# Patient Record
Sex: Male | Born: 1950
Health system: Southern US, Community
[De-identification: ages and names within clinical notes are randomized; demographics above are authoritative.]

## PROBLEM LIST (undated history)

## (undated) DIAGNOSIS — J449 Chronic obstructive pulmonary disease, unspecified: Secondary | ICD-10-CM

## (undated) DIAGNOSIS — I10 Essential (primary) hypertension: Secondary | ICD-10-CM

## (undated) DIAGNOSIS — C3490 Malignant neoplasm of unspecified part of unspecified bronchus or lung: Secondary | ICD-10-CM

## (undated) HISTORY — PX: LUNG SURGERY: SHX703

## (undated) HISTORY — PX: KNEE ARTHROSCOPY: SUR90

## (undated) HISTORY — DX: Malignant neoplasm of unspecified part of unspecified bronchus or lung: C34.90

## (undated) HISTORY — DX: Chronic obstructive pulmonary disease, unspecified: J44.9

---

## 2002-03-07 ENCOUNTER — Encounter: Payer: Self-pay | Admitting: Cardiology

## 2002-03-07 ENCOUNTER — Encounter: Admission: RE | Admit: 2002-03-07 | Discharge: 2002-03-07 | Payer: Self-pay | Admitting: Cardiology

## 2002-03-29 ENCOUNTER — Encounter: Payer: Self-pay | Admitting: Thoracic Surgery

## 2002-04-01 ENCOUNTER — Inpatient Hospital Stay (HOSPITAL_COMMUNITY): Admission: RE | Admit: 2002-04-01 | Discharge: 2002-04-06 | Payer: Self-pay | Admitting: Thoracic Surgery

## 2002-04-01 ENCOUNTER — Encounter: Payer: Self-pay | Admitting: Thoracic Surgery

## 2002-04-02 ENCOUNTER — Encounter: Payer: Self-pay | Admitting: Thoracic Surgery

## 2002-04-03 ENCOUNTER — Encounter: Payer: Self-pay | Admitting: Thoracic Surgery

## 2002-04-04 ENCOUNTER — Encounter: Payer: Self-pay | Admitting: Thoracic Surgery

## 2002-04-05 ENCOUNTER — Encounter: Payer: Self-pay | Admitting: Oncology

## 2002-04-06 ENCOUNTER — Encounter: Payer: Self-pay | Admitting: Thoracic Surgery

## 2002-04-12 ENCOUNTER — Encounter: Payer: Self-pay | Admitting: Thoracic Surgery

## 2002-04-12 ENCOUNTER — Encounter: Admission: RE | Admit: 2002-04-12 | Discharge: 2002-04-12 | Payer: Self-pay | Admitting: Thoracic Surgery

## 2002-04-29 ENCOUNTER — Ambulatory Visit: Admission: RE | Admit: 2002-04-29 | Discharge: 2002-07-07 | Payer: Self-pay | Admitting: *Deleted

## 2002-05-03 ENCOUNTER — Encounter: Admission: RE | Admit: 2002-05-03 | Discharge: 2002-05-03 | Payer: Self-pay | Admitting: Thoracic Surgery

## 2002-05-03 ENCOUNTER — Encounter: Payer: Self-pay | Admitting: Thoracic Surgery

## 2002-06-30 ENCOUNTER — Emergency Department (HOSPITAL_COMMUNITY): Admission: EM | Admit: 2002-06-30 | Discharge: 2002-06-30 | Payer: Self-pay | Admitting: Emergency Medicine

## 2002-07-26 ENCOUNTER — Ambulatory Visit: Admission: RE | Admit: 2002-07-26 | Discharge: 2002-07-26 | Payer: Self-pay | Admitting: *Deleted

## 2002-09-25 ENCOUNTER — Ambulatory Visit (HOSPITAL_COMMUNITY): Admission: RE | Admit: 2002-09-25 | Discharge: 2002-09-25 | Payer: Self-pay | Admitting: Oncology

## 2002-09-25 ENCOUNTER — Encounter: Payer: Self-pay | Admitting: Oncology

## 2002-10-29 ENCOUNTER — Ambulatory Visit (HOSPITAL_COMMUNITY): Admission: RE | Admit: 2002-10-29 | Discharge: 2002-10-29 | Payer: Self-pay | Admitting: Oncology

## 2002-10-29 ENCOUNTER — Encounter: Payer: Self-pay | Admitting: Oncology

## 2003-01-31 ENCOUNTER — Encounter: Payer: Self-pay | Admitting: Oncology

## 2003-01-31 ENCOUNTER — Ambulatory Visit (HOSPITAL_COMMUNITY): Admission: RE | Admit: 2003-01-31 | Discharge: 2003-01-31 | Payer: Self-pay | Admitting: Oncology

## 2003-05-29 ENCOUNTER — Ambulatory Visit (HOSPITAL_COMMUNITY): Admission: RE | Admit: 2003-05-29 | Discharge: 2003-05-29 | Payer: Self-pay | Admitting: Oncology

## 2003-08-26 ENCOUNTER — Ambulatory Visit (HOSPITAL_COMMUNITY): Admission: RE | Admit: 2003-08-26 | Discharge: 2003-08-26 | Payer: Self-pay | Admitting: Oncology

## 2004-01-05 ENCOUNTER — Ambulatory Visit (HOSPITAL_COMMUNITY): Admission: RE | Admit: 2004-01-05 | Discharge: 2004-01-05 | Payer: Self-pay | Admitting: Oncology

## 2004-01-16 ENCOUNTER — Ambulatory Visit (HOSPITAL_COMMUNITY): Admission: RE | Admit: 2004-01-16 | Discharge: 2004-01-16 | Payer: Self-pay | Admitting: Oncology

## 2004-02-22 ENCOUNTER — Emergency Department (HOSPITAL_COMMUNITY): Admission: EM | Admit: 2004-02-22 | Discharge: 2004-02-22 | Payer: Self-pay | Admitting: Emergency Medicine

## 2004-04-05 ENCOUNTER — Emergency Department (HOSPITAL_COMMUNITY): Admission: EM | Admit: 2004-04-05 | Discharge: 2004-04-05 | Payer: Self-pay | Admitting: Emergency Medicine

## 2004-04-23 ENCOUNTER — Ambulatory Visit (HOSPITAL_COMMUNITY): Admission: RE | Admit: 2004-04-23 | Discharge: 2004-04-23 | Payer: Self-pay | Admitting: Oncology

## 2004-05-24 ENCOUNTER — Ambulatory Visit: Payer: Self-pay | Admitting: Oncology

## 2004-09-02 ENCOUNTER — Ambulatory Visit: Payer: Self-pay | Admitting: Oncology

## 2004-09-06 ENCOUNTER — Ambulatory Visit (HOSPITAL_COMMUNITY): Admission: RE | Admit: 2004-09-06 | Discharge: 2004-09-06 | Payer: Self-pay | Admitting: Oncology

## 2004-12-28 ENCOUNTER — Ambulatory Visit: Payer: Self-pay | Admitting: Oncology

## 2005-07-02 ENCOUNTER — Emergency Department (HOSPITAL_COMMUNITY): Admission: EM | Admit: 2005-07-02 | Discharge: 2005-07-02 | Payer: Self-pay | Admitting: Emergency Medicine

## 2006-02-27 ENCOUNTER — Ambulatory Visit: Payer: Self-pay | Admitting: Oncology

## 2006-03-09 LAB — COMPREHENSIVE METABOLIC PANEL
ALT: 15 U/L (ref 0–53)
AST: 15 U/L (ref 0–37)
Calcium: 9.5 mg/dL (ref 8.4–10.5)
Chloride: 103 mEq/L (ref 96–112)
Creatinine, Ser: 1.11 mg/dL (ref 0.40–1.50)
Potassium: 4.2 mEq/L (ref 3.5–5.3)
Sodium: 138 mEq/L (ref 135–145)
Total Protein: 6.8 g/dL (ref 6.0–8.3)

## 2006-03-09 LAB — CBC WITH DIFFERENTIAL/PLATELET
BASO%: 0.5 % (ref 0.0–2.0)
EOS%: 1.1 % (ref 0.0–7.0)
MCH: 31.6 pg (ref 28.0–33.4)
MCHC: 34.9 g/dL (ref 32.0–35.9)
NEUT%: 64.8 % (ref 40.0–75.0)
RBC: 5.02 10*6/uL (ref 4.20–5.71)
RDW: 12.8 % (ref 11.2–14.6)
WBC: 9.6 10*3/uL (ref 4.0–10.0)
lymph#: 2.1 10*3/uL (ref 0.9–3.3)

## 2006-03-17 ENCOUNTER — Ambulatory Visit (HOSPITAL_COMMUNITY): Admission: RE | Admit: 2006-03-17 | Discharge: 2006-03-17 | Payer: Self-pay | Admitting: Oncology

## 2006-04-07 ENCOUNTER — Ambulatory Visit: Admission: RE | Admit: 2006-04-07 | Discharge: 2006-04-07 | Payer: Self-pay | Admitting: Thoracic Surgery

## 2006-04-16 ENCOUNTER — Ambulatory Visit: Payer: Self-pay | Admitting: Oncology

## 2006-04-21 LAB — COMPREHENSIVE METABOLIC PANEL
ALT: 34 U/L (ref 0–53)
BUN: 15 mg/dL (ref 6–23)
CO2: 27 mEq/L (ref 19–32)
Calcium: 9.4 mg/dL (ref 8.4–10.5)
Chloride: 104 mEq/L (ref 96–112)
Creatinine, Ser: 1.07 mg/dL (ref 0.40–1.50)
Glucose, Bld: 104 mg/dL — ABNORMAL HIGH (ref 70–99)
Total Bilirubin: 0.5 mg/dL (ref 0.3–1.2)

## 2006-04-21 LAB — LACTATE DEHYDROGENASE: LDH: 155 U/L (ref 94–250)

## 2006-04-21 LAB — CBC WITH DIFFERENTIAL/PLATELET
BASO%: 1.6 % (ref 0.0–2.0)
Basophils Absolute: 0.1 10*3/uL (ref 0.0–0.1)
HCT: 44.6 % (ref 38.7–49.9)
HGB: 15.6 g/dL (ref 13.0–17.1)
LYMPH%: 25.7 % (ref 14.0–48.0)
MCHC: 35 g/dL (ref 32.0–35.9)
MONO#: 0.8 10*3/uL (ref 0.1–0.9)
NEUT%: 60.6 % (ref 40.0–75.0)
Platelets: 300 10*3/uL (ref 145–400)
WBC: 8.6 10*3/uL (ref 4.0–10.0)
lymph#: 2.2 10*3/uL (ref 0.9–3.3)

## 2006-04-21 LAB — CEA: CEA: 2.5 ng/mL (ref 0.0–5.0)

## 2010-05-15 ENCOUNTER — Encounter: Payer: Self-pay | Admitting: Oncology

## 2010-05-16 ENCOUNTER — Encounter: Payer: Self-pay | Admitting: Oncology

## 2011-02-09 ENCOUNTER — Other Ambulatory Visit: Payer: Self-pay

## 2011-02-09 ENCOUNTER — Emergency Department (HOSPITAL_COMMUNITY)
Admission: EM | Admit: 2011-02-09 | Discharge: 2011-02-09 | Disposition: A | Discharge status: Home / Self Care | Payer: Self-pay | Attending: Emergency Medicine | Admitting: Emergency Medicine

## 2011-02-09 ENCOUNTER — Encounter: Payer: Self-pay | Admitting: *Deleted

## 2011-02-09 DIAGNOSIS — R51 Headache: Secondary | ICD-10-CM | POA: Insufficient documentation

## 2011-02-09 DIAGNOSIS — Z87891 Personal history of nicotine dependence: Secondary | ICD-10-CM | POA: Insufficient documentation

## 2011-02-09 DIAGNOSIS — Z7982 Long term (current) use of aspirin: Secondary | ICD-10-CM | POA: Insufficient documentation

## 2011-02-09 DIAGNOSIS — I10 Essential (primary) hypertension: Secondary | ICD-10-CM | POA: Insufficient documentation

## 2011-02-09 HISTORY — DX: Essential (primary) hypertension: I10

## 2011-02-09 LAB — URINALYSIS, ROUTINE W REFLEX MICROSCOPIC
Glucose, UA: NEGATIVE mg/dL
Hgb urine dipstick: NEGATIVE
Ketones, ur: NEGATIVE mg/dL
Protein, ur: NEGATIVE mg/dL

## 2011-02-09 LAB — CBC
HCT: 41.7 % (ref 39.0–52.0)
Hemoglobin: 14.9 g/dL (ref 13.0–17.0)
MCH: 30.7 pg (ref 26.0–34.0)
MCHC: 35.7 g/dL (ref 30.0–36.0)

## 2011-02-09 LAB — BASIC METABOLIC PANEL
BUN: 14 mg/dL (ref 6–23)
CO2: 28 mEq/L (ref 19–32)
Chloride: 104 mEq/L (ref 96–112)
Glucose, Bld: 111 mg/dL — ABNORMAL HIGH (ref 70–99)
Potassium: 3.8 mEq/L (ref 3.5–5.1)

## 2011-02-09 MED ORDER — HYDROCHLOROTHIAZIDE 25 MG PO TABS: 12.5000 mg | ORAL_TABLET | Freq: Every day | ORAL | Status: DC

## 2011-02-09 NOTE — ED Notes (Signed)
Pt reports his BP has is elevated and is presently has a headache.  Pt reports headache is intermittent.  Denies any nausea, vomiting, CP or SOB.  No distress noted.  IV started, labs drawn and EDP at bedside.

## 2011-02-09 NOTE — ED Notes (Signed)
Pt reports he has been out of his blood pressure meds for approx 10 days, pt reports he is now having a headache for the past couple of days

## 2011-02-09 NOTE — ED Provider Notes (Signed)
History     CSN: [id number] Arrival date & time: 02/09/2011  8:28 PM   First MD Initiated Contact with Patient 02/09/11 2035      Chief Complaint  Patient presents with  . Hypertension  . Headache    (Consider location/radiation/quality/duration/timing/severity/associated sxs/prior treatment) HPI The patient presents with concerns of hypertension, any new headache. He notes that he has generally been in his usual state of health, but ran out of money, and subsequently medications approximately 2 weeks ago. He now presents with a headache began gradually 3 days prior to admission and since onset has been mild diffuse nonradiating, not preventing, not worsened by anything. Today with persistent headache he checked his blood pressure at a [facility] and found to be elevated. He denies any current nausea, vomiting, visual changes, confusion, chest pain, dyspnea, abdominal pain, fevers, chills, edema.  Past Medical History  Diagnosis Date  . Hypertension     Past Surgical History  Procedure Date  . Lung surgery     No family history on file.  History  Substance Use Topics  . Smoking status: Former Games developer  . Smokeless tobacco: Not on file  . Alcohol Use: No      Review of Systems  All other systems reviewed and are negative.    Allergies  Sulfa antibiotics  Home Medications   Current Outpatient Rx  Name Route Sig Dispense Refill  . ASPIRIN 325 MG PO TABS Oral Take 325 mg by mouth daily.      [name] HYDROCORTISONE 0.5 % EX CREA Topical Apply 1 application topically as needed. For anti-itch     . VALSARTAN-HYDROCHLOROTHIAZIDE 160-12.5 MG PO TABS Oral Take 1 tablet by mouth daily.        BP 175/96  Pulse 71  Temp(Src) 98.5 F (36.9 C) (Oral)  Resp 20  Ht 5' 11.5" (1.816 m)  Wt 200 lb (90.719 kg)  BMI 27.51 kg/m2  SpO2 100%  Physical Exam  Constitutional: He is oriented to person, place, and time. He appears well-developed and well-nourished.  HENT:  Head:  Normocephalic and atraumatic.  Eyes: Conjunctivae are normal. Pupils are equal, round, and reactive to light.  Neck: Neck supple.  Cardiovascular: Normal rate and regular rhythm.   Pulmonary/Chest: No respiratory distress.  Abdominal: Soft. There is no tenderness.  Musculoskeletal: He exhibits no edema.  Neurological: He is alert and oriented to person, place, and time.  Skin: Skin is warm and dry.  Psychiatric: He has a normal mood and affect.    ED Course  Procedures (including critical care time)  Labs Reviewed  BASIC METABOLIC PANEL - Abnormal; Notable for the following:    Glucose, Bld 111 (*)    GFR calc non Af Amer 88 (*)    All other components within normal limits  URINALYSIS, ROUTINE W REFLEX MICROSCOPIC - Abnormal; Notable for the following:    Specific Gravity, Urine >1.030 (*)    All other components within normal limits  CBC   No results found.   No diagnosis found.   Date: 02/09/2011  Rate: 72  Rhythm: normal sinus rhythm  QRS Axis: normal  Intervals: normal  ST/T Wave abnormalities: normal  Conduction Disutrbances:none  Narrative Interpretation:   Old EKG Reviewed: none available    MDM  This gentleman presents with concerns of headache and ongoing blood pressure issues. On evaluation the patient is no distress and has no focal PE findings. EDD is nonischemic, sweats cannot demonstrate acute end organ effects of hypertension. Discussion  was held with patient regarding his capacity to afford medications. He notes that he is in the process of obtaining submental income. He will be will forward a for a co-pay for hydrochlorothiazide, which is half of his date daily regimen, and he was provided a prescription for this medication.        Gerhard Munch, MD 02/09/11 2141

## 2011-10-04 ENCOUNTER — Other Ambulatory Visit (HOSPITAL_COMMUNITY): Payer: Self-pay | Admitting: Nurse Practitioner

## 2011-10-05 ENCOUNTER — Ambulatory Visit (HOSPITAL_COMMUNITY)
Admission: RE | Admit: 2011-10-05 | Discharge: 2011-10-05 | Disposition: A | Discharge status: Home / Self Care | Payer: Self-pay | Source: Ambulatory Visit | Attending: Nurse Practitioner | Admitting: Nurse Practitioner

## 2011-10-05 ENCOUNTER — Other Ambulatory Visit (HOSPITAL_COMMUNITY): Payer: Self-pay | Admitting: Nurse Practitioner

## 2011-10-05 DIAGNOSIS — N63 Unspecified lump in unspecified breast: Secondary | ICD-10-CM | POA: Insufficient documentation

## 2013-09-28 ENCOUNTER — Emergency Department (HOSPITAL_COMMUNITY): Payer: Self-pay

## 2013-09-28 ENCOUNTER — Emergency Department (HOSPITAL_COMMUNITY)
Admission: EM | Admit: 2013-09-28 | Discharge: 2013-09-28 | Disposition: A | Discharge status: Home / Self Care | Payer: Self-pay | Attending: Emergency Medicine | Admitting: Emergency Medicine

## 2013-09-28 ENCOUNTER — Encounter (HOSPITAL_COMMUNITY): Payer: Self-pay | Admitting: Emergency Medicine

## 2013-09-28 DIAGNOSIS — Z9889 Other specified postprocedural states: Secondary | ICD-10-CM | POA: Insufficient documentation

## 2013-09-28 DIAGNOSIS — J441 Chronic obstructive pulmonary disease with (acute) exacerbation: Secondary | ICD-10-CM | POA: Insufficient documentation

## 2013-09-28 DIAGNOSIS — Z79899 Other long term (current) drug therapy: Secondary | ICD-10-CM | POA: Insufficient documentation

## 2013-09-28 DIAGNOSIS — J209 Acute bronchitis, unspecified: Secondary | ICD-10-CM

## 2013-09-28 DIAGNOSIS — F17200 Nicotine dependence, unspecified, uncomplicated: Secondary | ICD-10-CM | POA: Insufficient documentation

## 2013-09-28 DIAGNOSIS — Z720 Tobacco use: Secondary | ICD-10-CM

## 2013-09-28 DIAGNOSIS — I10 Essential (primary) hypertension: Secondary | ICD-10-CM | POA: Insufficient documentation

## 2013-09-28 DIAGNOSIS — J449 Chronic obstructive pulmonary disease, unspecified: Secondary | ICD-10-CM

## 2013-09-28 DIAGNOSIS — Z87891 Personal history of nicotine dependence: Secondary | ICD-10-CM | POA: Insufficient documentation

## 2013-09-28 DIAGNOSIS — J029 Acute pharyngitis, unspecified: Secondary | ICD-10-CM | POA: Insufficient documentation

## 2013-09-28 MED ORDER — LEVOFLOXACIN 750 MG PO TABS: 750.0000 mg | ORAL_TABLET | Freq: Every day | ORAL | Status: DC

## 2013-09-28 MED ORDER — LEVOFLOXACIN 750 MG PO TABS
750.0000 mg | ORAL_TABLET | Freq: Once | ORAL | Status: AC
  Administered 2013-09-28: 750 mg via ORAL
  Filled 2013-09-28: qty 1

## 2013-09-28 MED ORDER — ALBUTEROL SULFATE HFA 108 (90 BASE) MCG/ACT IN AERS
2.0000 | INHALATION_SPRAY | RESPIRATORY_TRACT | Status: DC | PRN
  Filled 2013-09-28: qty 6.7

## 2013-09-28 MED ORDER — AEROCHAMBER Z-STAT PLUS/MEDIUM MISC
1.0000 | Freq: Once | Status: AC
  Administered 2013-09-28: 1
  Filled 2013-09-28: qty 1

## 2013-09-28 MED ORDER — ALBUTEROL SULFATE HFA 108 (90 BASE) MCG/ACT IN AERS
2.0000 | INHALATION_SPRAY | Freq: Once | RESPIRATORY_TRACT | Status: AC
  Administered 2013-09-28: 2 via RESPIRATORY_TRACT

## 2013-09-28 NOTE — ED Notes (Signed)
Instructed on INH use.

## 2013-09-28 NOTE — ED Notes (Signed)
Pt reporting productive cough for 2-3 days.  Reports history of COPD, but denies increased SOB from baseline.

## 2013-09-28 NOTE — ED Notes (Signed)
Patient with no complaints at this time. Respirations even and unlabored. Skin warm/dry. Discharge instructions reviewed with patient at this time. Patient given opportunity to voice concerns/ask questions. Patient discharged at this time and left Emergency Department with steady gait.   

## 2013-09-28 NOTE — Discharge Instructions (Signed)
Use the inhaler, 2 puffs every 3-4 hours as needed for cough or trouble breathing. Try to stop smoking.     Acute Bronchitis Bronchitis is inflammation of the airways that extend from the windpipe into the lungs (bronchi). The inflammation often causes mucus to develop. This leads to a cough, which is the most common symptom of bronchitis.  In acute bronchitis, the condition usually develops suddenly and goes away over time, usually in a couple weeks. Smoking, allergies, and asthma can make bronchitis worse. Repeated episodes of bronchitis may cause further lung problems.  CAUSES Acute bronchitis is most often caused by the same virus that causes a cold. The virus can spread from person to person (contagious).  SIGNS AND SYMPTOMS   Cough.   Fever.   Coughing up mucus.   Body aches.   Chest congestion.   Chills.   Shortness of breath.   Sore throat.  DIAGNOSIS  Acute bronchitis is usually diagnosed through a physical exam. Tests, such as chest X-rays, are sometimes done to rule out other conditions.  TREATMENT  Acute bronchitis usually goes away in a couple weeks. Often times, no medical treatment is necessary. Medicines are sometimes given for relief of fever or cough. Antibiotics are usually not needed but may be prescribed in certain situations. In some cases, an inhaler may be recommended to help reduce shortness of breath and control the cough. A cool mist vaporizer may also be used to help thin bronchial secretions and make it easier to clear the chest.  HOME CARE INSTRUCTIONS  Get plenty of rest.   Drink enough fluids to keep your urine clear or pale yellow (unless you have a medical condition that requires fluid restriction). Increasing fluids may help thin your secretions and will prevent dehydration.   Only take over-the-counter or prescription medicines as directed by your health care provider.   Avoid smoking and secondhand smoke. Exposure to cigarette  smoke or irritating chemicals will make bronchitis worse. If you are a smoker, consider using nicotine gum or skin patches to help control withdrawal symptoms. Quitting smoking will help your lungs heal faster.   Reduce the chances of another bout of acute bronchitis by washing your hands frequently, avoiding people with cold symptoms, and trying not to touch your hands to your mouth, nose, or eyes.   Follow up with your health care provider as directed.  SEEK MEDICAL CARE IF: Your symptoms do not improve after 1 week of treatment.  SEEK IMMEDIATE MEDICAL CARE IF:  You develop an increased fever or chills.   You have chest pain.   You have severe shortness of breath.  You have bloody sputum.   You develop dehydration.  You develop fainting.  You develop repeated vomiting.  You develop a severe headache. MAKE SURE YOU:   Understand these instructions.  Will watch your condition.  Will get help right away if you are not doing well or get worse. Document Released: 05/19/2004 Document Revised: 12/12/2012 Document Reviewed: 10/02/2012 [facility] Patient Information 2014 [place].  Smoking Cessation Quitting smoking is important to your health and has many advantages. However, it is not always easy to quit since nicotine is a very addictive drug. Often times, people try 3 times or more before being able to quit. This document explains the best ways for you to prepare to quit smoking. Quitting takes hard work and a lot of effort, but you can do it. ADVANTAGES OF QUITTING SMOKING  You will live longer, feel better, and  live better.  Your body will feel the impact of quitting smoking almost immediately.  Within 20 minutes, blood pressure decreases. Your pulse returns to its normal level.  After 8 hours, carbon monoxide levels in the blood return to normal. Your oxygen level increases.  After 24 hours, the chance of having a heart attack starts to decrease. Your breath,  hair, and body stop smelling like smoke.  After 48 hours, damaged nerve endings begin to recover. Your sense of taste and smell improve.  After 72 hours, the body is virtually free of nicotine. Your bronchial tubes relax and breathing becomes easier.  After 2 to 12 weeks, lungs can hold more air. Exercise becomes easier and circulation improves.  The risk of having a heart attack, stroke, cancer, or lung disease is greatly reduced.  After 1 year, the risk of coronary heart disease is cut in half.  After 5 years, the risk of stroke falls to the same as a nonsmoker.  After 10 years, the risk of lung cancer is cut in half and the risk of other cancers decreases significantly.  After 15 years, the risk of coronary heart disease drops, usually to the level of a nonsmoker.  If you are pregnant, quitting smoking will improve your chances of having a healthy baby.  The people you live with, especially any children, will be healthier.  You will have extra money to spend on things other than cigarettes. QUESTIONS TO THINK ABOUT BEFORE ATTEMPTING TO QUIT You may want to talk about your answers with your caregiver.  Why do you want to quit?  If you tried to quit in the past, what helped and what did not?  What will be the most difficult situations for you after you quit? How will you plan to handle them?  Who can help you through the tough times? Your family? Friends? A caregiver?  What pleasures do you get from smoking? What ways can you still get pleasure if you quit? Here are some questions to ask your caregiver:  How can you help me to be successful at quitting?  What medicine do you think would be best for me and how should I take it?  What should I do if I need more help?  What is smoking withdrawal like? How can I get information on withdrawal? GET READY  Set a quit date.  Change your environment by getting rid of all cigarettes, ashtrays, matches, and lighters in your  home, car, or work. Do not let people smoke in your home.  Review your past attempts to quit. Think about what worked and what did not. GET SUPPORT AND ENCOURAGEMENT You have a better chance of being successful if you have help. You can get support in many ways.  Tell your family, friends, and co-workers that you are going to quit and need their support. Ask them not to smoke around you.  Get individual, group, or telephone counseling and support. Programs are available at [employer] and health centers. Call your local health department for information about programs in your area.  Spiritual beliefs and practices may help some smokers quit.  Download a "quit meter" on your computer to keep track of quit statistics, such as how long you have gone without smoking, cigarettes not smoked, and money saved.  Get a self-help book about quitting smoking and staying off of tobacco. [place] yourself from urges to smoke. Talk to someone, go for a walk, or occupy  your time with a task.  Change your normal routine. Take a different route to work. Drink tea instead of coffee. Eat breakfast in a different place.  Reduce your stress. Take a hot bath, exercise, or read a book.  Plan something enjoyable to do every day. Reward yourself for not smoking.  Explore interactive web-based programs that specialize in helping you quit. GET MEDICINE AND USE IT CORRECTLY Medicines can help you stop smoking and decrease the urge to smoke. Combining medicine with the above behavioral methods and support can greatly increase your chances of successfully quitting smoking.  Nicotine replacement therapy helps deliver nicotine to your body without the negative effects and risks of smoking. Nicotine replacement therapy includes nicotine gum, lozenges, inhalers, nasal sprays, and skin patches. Some may be available over-the-counter and others require a prescription.  Antidepressant  medicine helps people abstain from smoking, but how this works is unknown. This medicine is available by prescription.  Nicotinic receptor partial agonist medicine simulates the effect of nicotine in your brain. This medicine is available by prescription. Ask your caregiver for advice about which medicines to use and how to use them based on your health history. Your caregiver will tell you what side effects to look out for if you choose to be on a medicine or therapy. Carefully read the information on the package. Do not use any other product containing nicotine while using a nicotine replacement product.  RELAPSE OR DIFFICULT SITUATIONS Most relapses occur within the first 3 months after quitting. Do not be discouraged if you start smoking again. Remember, most people try several times before finally quitting. You may have symptoms of withdrawal because your body is used to nicotine. You may crave cigarettes, be irritable, feel very hungry, cough often, get headaches, or have difficulty concentrating. The withdrawal symptoms are only temporary. They are strongest when you first quit, but they will go away within 10 14 days. To reduce the chances of relapse, try to:  Avoid drinking alcohol. Drinking lowers your chances of successfully quitting.  Reduce the amount of caffeine you consume. Once you quit smoking, the amount of caffeine in your body increases and can give you symptoms, such as a rapid heartbeat, sweating, and anxiety.  Avoid smokers because they can make you want to smoke.  Do not let weight gain distract you. Many smokers will gain weight when they quit, usually less than 10 pounds. Eat a healthy diet and stay active. You can always lose the weight gained after you quit.  Find ways to improve your mood other than smoking. FOR MORE INFORMATION  www.smokefree.gov  Document Released: 04/05/2001 Document Revised: 10/11/2011 Document Reviewed: 07/21/2011 [facility] Patient Information  2014 [place], [place].  Smoking Hazards Smoking cigarettes is extremely bad for your health. Tobacco smoke has over 200 known poisons in it. It contains the poisonous gases nitrogen oxide and carbon monoxide. There are over 60 chemicals in tobacco smoke that cause cancer. Some of the chemicals found in cigarette smoke include:   Cyanide.   Benzene.   Formaldehyde.   Methanol (wood alcohol).   Acetylene (fuel used in welding torches).   Ammonia.  Even smoking lightly shortens your life expectancy by several years. You can greatly reduce the risk of medical problems for you and your family by stopping now. Smoking is the most preventable cause of death and disease in our society. Within days of quitting smoking, your circulation improves, you decrease the risk of having a heart attack, and your lung capacity  improves. There may be some increased phlegm in the first few days after quitting, and it may take months for your lungs to clear up completely. Quitting for 10 years reduces your risk of developing lung cancer to almost that of a nonsmoker.  WHAT ARE THE RISKS OF SMOKING? Cigarette smokers have an increased risk of many serious medical problems, including:  Lung cancer.   Lung disease (such as pneumonia, bronchitis, and emphysema).   Heart attack and chest pain due to the heart not getting enough oxygen (angina).   Heart disease and peripheral blood vessel disease.   Hypertension.   Stroke.   Oral cancer (cancer of the lip, mouth, or voice box).   Bladder cancer.   Pancreatic cancer.   Cervical cancer.   Pregnancy complications, including premature birth.   Stillbirths and smaller newborn babies, birth defects, and genetic damage to sperm.   Early menopause.   Lower estrogen level for women.   Infertility.   Facial wrinkles.   Blindness.   Increased risk of broken bones (fractures).   Senile dementia.   Stomach ulcers and internal  bleeding.   Delayed wound healing and increased risk of complications during surgery. Because of secondhand smoke exposure, children of smokers have an increased risk of the following:   Sudden infant death syndrome (SIDS).   Respiratory infections.   Lung cancer.   Heart disease.   Ear infections.  WHY IS SMOKING ADDICTIVE? Nicotine is the chemical agent in tobacco that is capable of causing addiction or dependence. When you smoke and inhale, nicotine is absorbed rapidly into the bloodstream through your lungs. Both inhaled and noninhaled nicotine may be addictive.  WHAT ARE THE BENEFITS OF QUITTING?  There are many health benefits to quitting smoking. Some are:   The likelihood of developing cancer and heart disease decreases. Health improvements are seen almost immediately.   Blood pressure, pulse rate, and breathing patterns start returning to normal soon after quitting.   People who quit may see an improvement in their overall quality of life.  HOW DO YOU QUIT SMOKING? Smoking is an addiction with both physical and psychological effects, and longtime habits can be hard to change. Your health care provider can recommend:  Programs and community resources, which may include group support, education, or therapy.  Replacement products, such as patches, gum, and nasal sprays. Use these products only as directed. Do not replace cigarette smoking with electronic cigarettes (commonly called e-cigarettes). The safety of e-cigarettes is unknown, and some may contain harmful chemicals. FOR MORE INFORMATION  American Lung Association: www.lung.org  American Cancer Society: www.cancer.org Document Released: 05/19/2004 Document Revised: 01/30/2013 Document Reviewed: 10/01/2012 [facility] Patient Information 2014 [place], [place].

## 2013-09-28 NOTE — ED Provider Notes (Signed)
CSN: [id number]     Arrival date & time 09/28/13  1903 History  This chart was scribed for Richarda Blade, MD by Steva Colder, ED Scribe. The patient was seen in room APA17/APA17 at 8:36 PM.   Chief Complaint  Patient presents with  . Cough   The history is provided by the patient. No language interpreter was used.   HPI Comments: Eric Leach is a 63 y.o. male with h/o of COPD who presents to the Emergency Department complaining of a productive cough with an associated sore throat onset 3 days ago. Pt states that the cough is productive of yellow sputum. He states that he didn't eat much today as a result of the sore throat. Pt has associated symptoms of weakness. Pt states that he has taken Tylenol for the sore throat.  Pt denies N/V and dizziness. He states that he is currently a smoker. He is currently retired.   Past Medical History  Diagnosis Date  . Hypertension    Past Surgical History  Procedure Laterality Date  . Lung surgery     History reviewed. No pertinent family history. History  Substance Use Topics  . Smoking status: Former Research scientist (life sciences)  . Smokeless tobacco: Not on file  . Alcohol Use: No    Review of Systems  Constitutional: Positive for fever (subjective).  HENT: Positive for sore throat. Negative for rhinorrhea and sneezing.   Respiratory: Positive for cough.   Gastrointestinal: Negative for nausea and vomiting.  Neurological: Positive for weakness. Negative for dizziness.  All other systems reviewed and are negative.  Allergies  Sulfa antibiotics  Home Medications   Prior to Admission medications   Medication Sig Start Date End Date Taking? Authorizing Provider  lisinopril-hydrochlorothiazide (PRINZIDE,ZESTORETIC) 20-12.5 MG per tablet Take 1 tablet by mouth daily.   Yes Historical Provider, MD  levofloxacin (LEVAQUIN) 750 MG tablet Take 1 tablet (750 mg total) by mouth daily. X 7 days 09/28/13   Richarda Blade, MD   BP 135/80  Pulse 96  Temp(Src) 99.2  F (37.3 C) (Oral)  Resp 24  Ht 5\' 11"  (1.803 m)  Wt 185 lb (83.915 kg)  BMI 25.81 kg/m2  SpO2 98% Physical Exam  Nursing note and vitals reviewed. Constitutional: He is oriented to person, place, and time. He appears well-developed and well-nourished.  HENT:  Head: Normocephalic and atraumatic.  Right Ear: External ear normal.  Left Ear: External ear normal.  Eyes: Conjunctivae and EOM are normal. Pupils are equal, round, and reactive to light.  Neck: Normal range of motion and phonation normal. Neck supple.  Cardiovascular: Normal rate, regular rhythm, normal heart sounds and intact distal pulses.   Pulmonary/Chest: Effort normal. He has decreased breath sounds. He has wheezes (few wheezes). He has rhonchi (scattered rhonchi bilaterally). He has no rales. He exhibits no bony tenderness.  Abdominal: Soft. There is no tenderness.  Musculoskeletal: Normal range of motion.  Neurological: He is alert and oriented to person, place, and time. No cranial nerve deficit or sensory deficit. He exhibits normal muscle tone. Coordination normal.  Skin: Skin is warm, dry and intact.  Psychiatric: He has a normal mood and affect. His behavior is normal. Judgment and thought content normal.    ED Course  Procedures (including critical care time)  DIAGNOSTIC STUDIES: Oxygen Saturation is 98% on room air, normal by my interpretation.    COORDINATION OF CARE: 8:43 PM-Discussed treatment plan with pt at bedside and pt agreed to plan.   9:19 PM -  Pt re-evaluated. Will discharge.  Medications  albuterol (PROVENTIL HFA;VENTOLIN HFA) 108 (90 BASE) MCG/ACT inhaler 2 puff (not administered)  levofloxacin (LEVAQUIN) tablet 750 mg (750 mg Oral Given 09/28/13 2058)  aerochamber Z-Stat Plus/medium 1 each (1 each Other Given 09/28/13 2058)  albuterol (PROVENTIL HFA;VENTOLIN HFA) 108 (90 BASE) MCG/ACT inhaler 2 puff (2 puffs Inhalation Given 09/28/13 2112)    Patient Vitals for the past 24 hrs:  BP Temp Temp  src Pulse Resp SpO2 Height Weight  09/28/13 1908 135/80 mmHg 99.2 F (37.3 C) Oral 96 24 98 % 5\' 11"  (1.803 m) 185 lb (83.915 kg)   Dg Chest 2 View  09/28/2013   CLINICAL DATA:  Cough  EXAM: CHEST  2 VIEW  COMPARISON:  April 07, 2006  FINDINGS: There is underlying emphysematous change. There is no edema or consolidation. There is postoperative change in the left hilar region and left apex; there is asymmetric left apical pleural thickening which is stable. Heart size is normal. Pulmonary vascularity appears within normal limits. No apparent adenopathy. No bone lesions.  IMPRESSION: Postoperative change on the left. Asymmetric pleural thickening on the left is stable. There is underlying emphysematous change. There is no edema or consolidation.   Electronically Signed   By: Lowella Grip M.D.   On: 09/28/2013 19:35    EKG Interpretation None      MDM   Final diagnoses:  Acute bronchitis  COPD (chronic obstructive pulmonary disease)  Tobacco abuse    Evaluation, consistent with bronchitis. No hypoxia, metabolic instability, or suspected bacterial infection.  Nursing Notes Reviewed/ Care Coordinated Applicable Imaging Reviewed Interpretation of Laboratory Data incorporated into ED treatment  The patient appears reasonably screened and/or stabilized for discharge and I doubt any other medical condition or other [facility] requiring further screening, evaluation, or treatment in the ED at this time prior to discharge.  Plan: Home Medications- Levaquin, Albuterol; Home Treatments- stop smoking; return here if the recommended treatment, does not improve the symptoms; Recommended follow up- PCP 1 week  I personally performed the services described in this documentation, which was scribed in my presence. The recorded information has been reviewed and is accurate.      Richarda Blade, MD 09/28/13 [phone]

## 2014-06-12 ENCOUNTER — Other Ambulatory Visit (HOSPITAL_COMMUNITY): Payer: Self-pay | Admitting: Family Medicine

## 2014-06-12 ENCOUNTER — Ambulatory Visit (HOSPITAL_COMMUNITY)
Admission: RE | Admit: 2014-06-12 | Discharge: 2014-06-12 | Disposition: A | Discharge status: Home / Self Care | Payer: 59 | Source: Ambulatory Visit | Attending: Family Medicine | Admitting: Family Medicine

## 2014-06-12 DIAGNOSIS — J449 Chronic obstructive pulmonary disease, unspecified: Secondary | ICD-10-CM | POA: Insufficient documentation

## 2014-06-12 DIAGNOSIS — R05 Cough: Secondary | ICD-10-CM | POA: Diagnosis not present

## 2014-06-12 DIAGNOSIS — J438 Other emphysema: Secondary | ICD-10-CM | POA: Diagnosis not present

## 2014-06-12 DIAGNOSIS — C3490 Malignant neoplasm of unspecified part of unspecified bronchus or lung: Secondary | ICD-10-CM | POA: Diagnosis not present

## 2015-03-11 ENCOUNTER — Telehealth: Payer: Self-pay | Admitting: Internal Medicine

## 2015-03-11 NOTE — Telephone Encounter (Signed)
error 

## 2017-10-05 ENCOUNTER — Emergency Department (HOSPITAL_COMMUNITY)
Admission: EM | Admit: 2017-10-05 | Discharge: 2017-10-05 | Disposition: A | Discharge status: Home / Self Care | Payer: Medicare Other | Attending: Physician Assistant | Admitting: Physician Assistant

## 2017-10-05 ENCOUNTER — Encounter (HOSPITAL_COMMUNITY): Payer: Self-pay | Admitting: Emergency Medicine

## 2017-10-05 ENCOUNTER — Other Ambulatory Visit: Payer: Self-pay

## 2017-10-05 ENCOUNTER — Emergency Department (HOSPITAL_COMMUNITY): Payer: Medicare Other

## 2017-10-05 DIAGNOSIS — J984 Other disorders of lung: Secondary | ICD-10-CM

## 2017-10-05 DIAGNOSIS — R918 Other nonspecific abnormal finding of lung field: Secondary | ICD-10-CM | POA: Diagnosis not present

## 2017-10-05 DIAGNOSIS — R0602 Shortness of breath: Secondary | ICD-10-CM | POA: Diagnosis present

## 2017-10-05 DIAGNOSIS — F17210 Nicotine dependence, cigarettes, uncomplicated: Secondary | ICD-10-CM | POA: Insufficient documentation

## 2017-10-05 LAB — BASIC METABOLIC PANEL
Anion gap: 11 (ref 5–15)
BUN: 10 mg/dL (ref 6–20)
CO2: 26 mmol/L (ref 22–32)
Calcium: 9.2 mg/dL (ref 8.9–10.3)
Chloride: 93 mmol/L — ABNORMAL LOW (ref 101–111)
Creatinine, Ser: 0.82 mg/dL (ref 0.61–1.24)
GFR calc Af Amer: >60 mL/min (ref >60)
GFR calc non Af Amer: >60 mL/min (ref >60)
Glucose, Bld: 173 mg/dL — ABNORMAL HIGH (ref 65–99)
Potassium: 4 mmol/L (ref 3.5–5.1)
Sodium: 130 mmol/L — ABNORMAL LOW (ref 135–145)

## 2017-10-05 LAB — CBC WITH DIFFERENTIAL/PLATELET
Abs Immature Granulocytes: 0.1 10*3/uL (ref 0.0–0.1)
Basophils Absolute: 0.1 10*3/uL (ref 0.0–0.1)
Basophils Relative: 0 %
Eosinophils Absolute: 0 10*3/uL (ref 0.0–0.7)
Eosinophils Relative: 0 %
HCT: 43.7 % (ref 39.0–52.0)
Hemoglobin: 14.5 g/dL (ref 13.0–17.0)
Immature Granulocytes: 0 %
Lymphocytes Relative: 11 %
Lymphs Abs: 1.8 10*3/uL (ref 0.7–4.0)
MCH: 27.4 pg (ref 26.0–34.0)
MCHC: 33.2 g/dL (ref 30.0–36.0)
MCV: 82.6 fL (ref 78.0–100.0)
Monocytes Absolute: 1 10*3/uL (ref 0.1–1.0)
Monocytes Relative: 6 %
Neutro Abs: 13.4 10*3/uL — ABNORMAL HIGH (ref 1.7–7.7)
Neutrophils Relative %: 83 %
Platelets: 395 10*3/uL (ref 150–400)
RBC: 5.29 MIL/uL (ref 4.22–5.81)
RDW: 12.9 % (ref 11.5–15.5)
WBC: 16.3 10*3/uL — ABNORMAL HIGH (ref 4.0–10.5)

## 2017-10-05 LAB — I-STAT TROPONIN, ED
Troponin i, poc: 0.01 ng/mL (ref 0.00–0.08)
Troponin i, poc: 0 ng/mL (ref 0.00–0.08)

## 2017-10-05 MED ORDER — LEVOFLOXACIN 750 MG PO TABS: 750.0000 mg | ORAL_TABLET | Freq: Every day | ORAL | 0 refills | Status: DC

## 2017-10-05 MED ORDER — IOHEXOL 300 MG/ML  SOLN
75.0000 mL | Freq: Once | INTRAMUSCULAR | Status: AC | PRN
  Administered 2017-10-05: 75 mL via INTRAVENOUS

## 2017-10-05 MED ORDER — LEVOFLOXACIN 750 MG PO TABS
750.0000 mg | ORAL_TABLET | Freq: Once | ORAL | Status: AC
  Administered 2017-10-05: 750 mg via ORAL
  Filled 2017-10-05: qty 1

## 2017-10-05 MED ORDER — SODIUM CHLORIDE 0.9 % IV BOLUS
1000.0000 mL | Freq: Once | INTRAVENOUS | Status: AC
  Administered 2017-10-05: 1000 mL via INTRAVENOUS

## 2017-10-05 NOTE — ED Notes (Signed)
ED Provider at bedside. 

## 2017-10-05 NOTE — ED Triage Notes (Addendum)
Pt states 2 days of hiccups that come and go. States they went away 40 minutes ago. No acute distress. Pt also hypertensive, no pain anywhere. States he has been out of his BP meds for months.

## 2017-10-05 NOTE — Discharge Instructions (Addendum)
Return here as needed. Follow up with your cancer doctors for a recheck of this lung issue.

## 2017-10-05 NOTE — ED Provider Notes (Signed)
Patient placed in Quick Look pathway, seen and evaluated   Chief Complaint: hiccups  HPI:   Patient presents with 2 day history of hiccups. He states that they went away approximately 40 minutes ago. Did result in some nausea but no vomiting. Also endorses mild shortness of breath with the persistent. He is a current smoker of just under a pack of cigarettes daily. He has a history of lung cancer but has not followed up with his PCP or oncologist in at least 2 years. Denies chest pain, fevers, or chills. He has a history of COPD.  ROS: positive for hiccups, shortness of breath, nausea negative for chest pain, fevers, chills  Physical Exam:   Gen: No distress  Neuro: Awake and Alert  Skin: Warm    Focused Exam: heart rate and rhythm regular, no murmurs rubs or gallops noted. 2+ radial pulses bilaterally. Patient has globally diminished breath sounds, scattered rhonchi and wheezes. Equal rise and fall of chest, speaking in full sentences without difficulty. No tenderness to palpation of the chest wall.   Initiation of care has begun. The patient has been counseled on the process, plan, and necessity for staying for the completion/evaluation, and the remainder of the medical screening examination    Renita Papa, PA-C 10/05/17 1407    Charlesetta Shanks, MD 10/06/17 1724

## 2018-08-21 DIAGNOSIS — J440 Chronic obstructive pulmonary disease with acute lower respiratory infection: Secondary | ICD-10-CM | POA: Diagnosis not present

## 2018-08-21 DIAGNOSIS — J9600 Acute respiratory failure, unspecified whether with hypoxia or hypercapnia: Secondary | ICD-10-CM | POA: Diagnosis not present

## 2018-08-21 DIAGNOSIS — E041 Nontoxic single thyroid nodule: Secondary | ICD-10-CM | POA: Diagnosis not present

## 2018-08-21 DIAGNOSIS — I10 Essential (primary) hypertension: Secondary | ICD-10-CM | POA: Diagnosis not present

## 2018-08-21 DIAGNOSIS — J189 Pneumonia, unspecified organism: Secondary | ICD-10-CM | POA: Diagnosis not present

## 2018-08-21 DIAGNOSIS — E46 Unspecified protein-calorie malnutrition: Secondary | ICD-10-CM | POA: Diagnosis not present

## 2018-08-21 DIAGNOSIS — J449 Chronic obstructive pulmonary disease, unspecified: Secondary | ICD-10-CM | POA: Diagnosis not present

## 2018-08-21 DIAGNOSIS — E048 Other specified nontoxic goiter: Secondary | ICD-10-CM | POA: Diagnosis not present

## 2018-08-21 DIAGNOSIS — J9601 Acute respiratory failure with hypoxia: Secondary | ICD-10-CM | POA: Diagnosis not present

## 2018-08-21 DIAGNOSIS — Z7401 Bed confinement status: Secondary | ICD-10-CM | POA: Diagnosis not present

## 2018-08-21 DIAGNOSIS — G9341 Metabolic encephalopathy: Secondary | ICD-10-CM | POA: Diagnosis not present

## 2018-08-21 DIAGNOSIS — R69 Illness, unspecified: Secondary | ICD-10-CM | POA: Diagnosis not present

## 2018-08-21 DIAGNOSIS — R0902 Hypoxemia: Secondary | ICD-10-CM | POA: Diagnosis not present

## 2018-08-21 DIAGNOSIS — J150 Pneumonia due to Klebsiella pneumoniae: Secondary | ICD-10-CM | POA: Diagnosis not present

## 2018-08-21 DIAGNOSIS — R0602 Shortness of breath: Secondary | ICD-10-CM | POA: Diagnosis not present

## 2018-08-21 DIAGNOSIS — J9602 Acute respiratory failure with hypercapnia: Secondary | ICD-10-CM | POA: Diagnosis not present

## 2018-08-21 DIAGNOSIS — R918 Other nonspecific abnormal finding of lung field: Secondary | ICD-10-CM | POA: Diagnosis not present

## 2018-08-21 DIAGNOSIS — B961 Klebsiella pneumoniae [K. pneumoniae] as the cause of diseases classified elsewhere: Secondary | ICD-10-CM | POA: Diagnosis not present

## 2018-08-21 DIAGNOSIS — J984 Other disorders of lung: Secondary | ICD-10-CM | POA: Diagnosis not present

## 2018-08-21 DIAGNOSIS — R4182 Altered mental status, unspecified: Secondary | ICD-10-CM | POA: Diagnosis not present

## 2018-08-21 DIAGNOSIS — S30861D Insect bite (nonvenomous) of abdominal wall, subsequent encounter: Secondary | ICD-10-CM | POA: Diagnosis not present

## 2018-08-21 DIAGNOSIS — L02413 Cutaneous abscess of right upper limb: Secondary | ICD-10-CM | POA: Diagnosis not present

## 2018-08-21 DIAGNOSIS — Z681 Body mass index (BMI) 19 or less, adult: Secondary | ICD-10-CM | POA: Diagnosis not present

## 2018-08-21 DIAGNOSIS — Z20828 Contact with and (suspected) exposure to other viral communicable diseases: Secondary | ICD-10-CM | POA: Diagnosis not present

## 2018-08-21 DIAGNOSIS — M6281 Muscle weakness (generalized): Secondary | ICD-10-CM | POA: Diagnosis not present

## 2018-08-21 DIAGNOSIS — N390 Urinary tract infection, site not specified: Secondary | ICD-10-CM | POA: Diagnosis not present

## 2018-08-22 DIAGNOSIS — Z20828 Contact with and (suspected) exposure to other viral communicable diseases: Secondary | ICD-10-CM | POA: Diagnosis not present

## 2018-08-22 DIAGNOSIS — J449 Chronic obstructive pulmonary disease, unspecified: Secondary | ICD-10-CM | POA: Diagnosis not present

## 2018-08-22 DIAGNOSIS — R69 Illness, unspecified: Secondary | ICD-10-CM | POA: Diagnosis not present

## 2018-08-23 DIAGNOSIS — R0602 Shortness of breath: Secondary | ICD-10-CM | POA: Diagnosis not present

## 2018-08-24 DIAGNOSIS — R0902 Hypoxemia: Secondary | ICD-10-CM | POA: Diagnosis not present

## 2018-08-24 DIAGNOSIS — I10 Essential (primary) hypertension: Secondary | ICD-10-CM | POA: Diagnosis not present

## 2018-08-25 DIAGNOSIS — R0902 Hypoxemia: Secondary | ICD-10-CM | POA: Diagnosis not present

## 2018-08-25 DIAGNOSIS — I10 Essential (primary) hypertension: Secondary | ICD-10-CM | POA: Diagnosis not present

## 2018-08-25 DIAGNOSIS — R918 Other nonspecific abnormal finding of lung field: Secondary | ICD-10-CM | POA: Diagnosis not present

## 2018-08-25 DIAGNOSIS — E048 Other specified nontoxic goiter: Secondary | ICD-10-CM | POA: Diagnosis not present

## 2018-08-25 DIAGNOSIS — R4182 Altered mental status, unspecified: Secondary | ICD-10-CM | POA: Diagnosis not present

## 2018-08-26 DIAGNOSIS — I10 Essential (primary) hypertension: Secondary | ICD-10-CM | POA: Diagnosis not present

## 2018-08-26 DIAGNOSIS — J189 Pneumonia, unspecified organism: Secondary | ICD-10-CM | POA: Diagnosis not present

## 2018-08-26 DIAGNOSIS — R4182 Altered mental status, unspecified: Secondary | ICD-10-CM | POA: Diagnosis not present

## 2018-08-26 DIAGNOSIS — M6281 Muscle weakness (generalized): Secondary | ICD-10-CM | POA: Diagnosis not present

## 2018-08-26 DIAGNOSIS — R918 Other nonspecific abnormal finding of lung field: Secondary | ICD-10-CM | POA: Diagnosis not present

## 2018-08-27 DIAGNOSIS — N390 Urinary tract infection, site not specified: Secondary | ICD-10-CM | POA: Diagnosis not present

## 2018-08-27 DIAGNOSIS — B961 Klebsiella pneumoniae [K. pneumoniae] as the cause of diseases classified elsewhere: Secondary | ICD-10-CM | POA: Diagnosis not present

## 2018-08-28 DIAGNOSIS — R0902 Hypoxemia: Secondary | ICD-10-CM | POA: Diagnosis not present

## 2018-08-28 DIAGNOSIS — E041 Nontoxic single thyroid nodule: Secondary | ICD-10-CM | POA: Diagnosis not present

## 2018-08-30 DIAGNOSIS — J189 Pneumonia, unspecified organism: Secondary | ICD-10-CM | POA: Diagnosis not present

## 2018-08-30 DIAGNOSIS — J449 Chronic obstructive pulmonary disease, unspecified: Secondary | ICD-10-CM | POA: Diagnosis not present

## 2018-08-30 DIAGNOSIS — E041 Nontoxic single thyroid nodule: Secondary | ICD-10-CM | POA: Diagnosis not present

## 2018-08-31 DIAGNOSIS — E041 Nontoxic single thyroid nodule: Secondary | ICD-10-CM | POA: Diagnosis not present

## 2018-08-31 DIAGNOSIS — J984 Other disorders of lung: Secondary | ICD-10-CM | POA: Diagnosis not present

## 2018-08-31 DIAGNOSIS — J150 Pneumonia due to Klebsiella pneumoniae: Secondary | ICD-10-CM | POA: Diagnosis not present

## 2018-09-01 DIAGNOSIS — B961 Klebsiella pneumoniae [K. pneumoniae] as the cause of diseases classified elsewhere: Secondary | ICD-10-CM | POA: Diagnosis not present

## 2018-09-01 DIAGNOSIS — Z20828 Contact with and (suspected) exposure to other viral communicable diseases: Secondary | ICD-10-CM | POA: Diagnosis not present

## 2018-09-01 DIAGNOSIS — E46 Unspecified protein-calorie malnutrition: Secondary | ICD-10-CM | POA: Diagnosis not present

## 2018-09-01 DIAGNOSIS — R0902 Hypoxemia: Secondary | ICD-10-CM | POA: Diagnosis not present

## 2018-09-01 DIAGNOSIS — J189 Pneumonia, unspecified organism: Secondary | ICD-10-CM | POA: Diagnosis not present

## 2018-09-01 DIAGNOSIS — R69 Illness, unspecified: Secondary | ICD-10-CM | POA: Diagnosis not present

## 2018-09-01 DIAGNOSIS — J9600 Acute respiratory failure, unspecified whether with hypoxia or hypercapnia: Secondary | ICD-10-CM | POA: Diagnosis not present

## 2018-09-01 DIAGNOSIS — Z7401 Bed confinement status: Secondary | ICD-10-CM | POA: Diagnosis not present

## 2018-09-01 DIAGNOSIS — Z9981 Dependence on supplemental oxygen: Secondary | ICD-10-CM | POA: Diagnosis not present

## 2018-09-01 DIAGNOSIS — J150 Pneumonia due to Klebsiella pneumoniae: Secondary | ICD-10-CM | POA: Diagnosis not present

## 2018-09-01 DIAGNOSIS — M6281 Muscle weakness (generalized): Secondary | ICD-10-CM | POA: Diagnosis not present

## 2018-09-01 DIAGNOSIS — S30861D Insect bite (nonvenomous) of abdominal wall, subsequent encounter: Secondary | ICD-10-CM | POA: Diagnosis not present

## 2018-09-01 DIAGNOSIS — E041 Nontoxic single thyroid nodule: Secondary | ICD-10-CM | POA: Diagnosis not present

## 2018-09-01 DIAGNOSIS — L02413 Cutaneous abscess of right upper limb: Secondary | ICD-10-CM | POA: Diagnosis not present

## 2018-09-01 DIAGNOSIS — J9610 Chronic respiratory failure, unspecified whether with hypoxia or hypercapnia: Secondary | ICD-10-CM | POA: Diagnosis not present

## 2018-09-01 DIAGNOSIS — I10 Essential (primary) hypertension: Secondary | ICD-10-CM | POA: Diagnosis not present

## 2018-09-04 DIAGNOSIS — J9610 Chronic respiratory failure, unspecified whether with hypoxia or hypercapnia: Secondary | ICD-10-CM | POA: Diagnosis not present

## 2018-09-04 DIAGNOSIS — J150 Pneumonia due to Klebsiella pneumoniae: Secondary | ICD-10-CM | POA: Diagnosis not present

## 2018-09-04 DIAGNOSIS — Z9981 Dependence on supplemental oxygen: Secondary | ICD-10-CM | POA: Diagnosis not present

## 2018-09-04 DIAGNOSIS — M6281 Muscle weakness (generalized): Secondary | ICD-10-CM | POA: Diagnosis not present

## 2018-09-04 DIAGNOSIS — I10 Essential (primary) hypertension: Secondary | ICD-10-CM | POA: Diagnosis not present

## 2018-09-06 DIAGNOSIS — Z9981 Dependence on supplemental oxygen: Secondary | ICD-10-CM | POA: Diagnosis not present

## 2018-09-06 DIAGNOSIS — M6281 Muscle weakness (generalized): Secondary | ICD-10-CM | POA: Diagnosis not present

## 2018-09-06 DIAGNOSIS — J150 Pneumonia due to Klebsiella pneumoniae: Secondary | ICD-10-CM | POA: Diagnosis not present

## 2018-09-06 DIAGNOSIS — I10 Essential (primary) hypertension: Secondary | ICD-10-CM | POA: Diagnosis not present

## 2018-09-06 DIAGNOSIS — J9610 Chronic respiratory failure, unspecified whether with hypoxia or hypercapnia: Secondary | ICD-10-CM | POA: Diagnosis not present

## 2018-09-11 DIAGNOSIS — M6281 Muscle weakness (generalized): Secondary | ICD-10-CM | POA: Diagnosis not present

## 2018-09-11 DIAGNOSIS — J9610 Chronic respiratory failure, unspecified whether with hypoxia or hypercapnia: Secondary | ICD-10-CM | POA: Diagnosis not present

## 2018-09-11 DIAGNOSIS — J150 Pneumonia due to Klebsiella pneumoniae: Secondary | ICD-10-CM | POA: Diagnosis not present

## 2018-09-11 DIAGNOSIS — I10 Essential (primary) hypertension: Secondary | ICD-10-CM | POA: Diagnosis not present

## 2018-09-11 DIAGNOSIS — Z9981 Dependence on supplemental oxygen: Secondary | ICD-10-CM | POA: Diagnosis not present

## 2018-09-13 DIAGNOSIS — J150 Pneumonia due to Klebsiella pneumoniae: Secondary | ICD-10-CM | POA: Diagnosis not present

## 2018-09-13 DIAGNOSIS — M6281 Muscle weakness (generalized): Secondary | ICD-10-CM | POA: Diagnosis not present

## 2018-09-13 DIAGNOSIS — J9610 Chronic respiratory failure, unspecified whether with hypoxia or hypercapnia: Secondary | ICD-10-CM | POA: Diagnosis not present

## 2018-09-13 DIAGNOSIS — Z9981 Dependence on supplemental oxygen: Secondary | ICD-10-CM | POA: Diagnosis not present

## 2018-09-13 DIAGNOSIS — J189 Pneumonia, unspecified organism: Secondary | ICD-10-CM | POA: Diagnosis not present

## 2018-09-13 DIAGNOSIS — I10 Essential (primary) hypertension: Secondary | ICD-10-CM | POA: Diagnosis not present

## 2018-09-14 ENCOUNTER — Telehealth: Payer: Self-pay | Admitting: *Deleted

## 2018-09-14 NOTE — Telephone Encounter (Signed)
"  Eric Leach [phone]).  Former patient of Dr. Eston Esters calling to make an appointment.   Live in [place] but currently in a rehab center after pneumonia in [facility].   A mass was seen on the F/U chest xray ordered by [facility] performed here yesterday by an outside company.   I was told to call to set up appointment.   I should be leaving rehab facility soon."  Noted Oncology "[facility] Conversion Encounters May 16, 2010 as most recent encounter.  Conveyed new patient coordinator will be notified of his request.  Reports a Dr. Berdine Addison as PCP.  Did not provide rehab facility name or rehab provider name when asked.    "I will work with [facility] to make referral."

## 2018-09-18 ENCOUNTER — Other Ambulatory Visit: Payer: Self-pay

## 2018-09-19 ENCOUNTER — Ambulatory Visit (INDEPENDENT_AMBULATORY_CARE_PROVIDER_SITE_OTHER): Payer: Medicare HMO | Admitting: Family Medicine

## 2018-09-19 ENCOUNTER — Encounter: Payer: Self-pay | Admitting: Family Medicine

## 2018-09-19 VITALS — BP 145/87 | HR 74 | Temp 97.2°F | Ht 71.0 in | Wt 155.2 lb

## 2018-09-19 DIAGNOSIS — C3492 Malignant neoplasm of unspecified part of left bronchus or lung: Secondary | ICD-10-CM

## 2018-09-19 DIAGNOSIS — I10 Essential (primary) hypertension: Secondary | ICD-10-CM | POA: Diagnosis not present

## 2018-09-19 DIAGNOSIS — N529 Male erectile dysfunction, unspecified: Secondary | ICD-10-CM

## 2018-09-19 MED ORDER — SILDENAFIL CITRATE 100 MG PO TABS: 50.0000 mg | ORAL_TABLET | Freq: Every day | ORAL | 11 refills | Status: DC | PRN

## 2018-09-19 MED ORDER — CLONIDINE HCL 0.1 MG PO TABS: 0.1000 mg | ORAL_TABLET | Freq: Two times a day (BID) | ORAL | 5 refills | Status: DC

## 2018-09-19 MED ORDER — LISINOPRIL-HYDROCHLOROTHIAZIDE 10-12.5 MG PO TABS: 1.0000 | ORAL_TABLET | Freq: Every day | ORAL | 3 refills | Status: DC

## 2018-09-19 NOTE — Progress Notes (Signed)
Subjective:  Patient ID: Eric Leach, male    DOB: May 03, 1950  Age: 68 y.o. MRN: [id number]  CC: New Patient (Initial Visit) and referral for CT of lungs   HPI Eric Leach presents for new patient visit with multiple concerns.   Follow-up of hypertension. Patient has no history of headache chest pain or shortness of breath or recent cough. Patient also denies symptoms of TIA such as numbness weakness lateralizing.  Patient has had multiple blood pressure checks recently through hospitalization etc. and has some elevated readings.  Additionally he would like to go back on the HCTZ component of the lisinopril this is due to wanting to have the diuretic that he has had in the past.  Clonidine agrees with him okay like to continue that as is.Patient denies side effects from his medication. States taking it regularly.  Patient suffers from erectile dysfunction.  He is responded very well to Viagra 100 mg in the past he would like to have that renewed.  He states he is not had side effects.  It worked quite well for him.  No known prostate conditions.  Patient was recently hospitalized with pneumonia.  He then went to [facility]. to a rehabilitation facility.  He says he left there early.  He is off of antibiotics now.  He is feeling much better.  He is going back for recheck tomorrow and to possibly put on more antibiotics.  Through the pneumonia he realizes he has not had his old lung cancer checked in some time.  He had a CT scan last June 2019 that showed a cavitating lesion at the left apex.  There was some sclerosis of the ribs 1 2 and 3 with perhaps some neoplastic involvement.  Patient says he never followed up on that because he could not get him with an oncologist.  He had his surgery originally in approximately 2008.  He has been lost to follow-up to the oncologist who he believes has retired.  He would like to have a referral to a oncologist and follow-up on his cancer.  Since  recovering from the pneumonia he says he is feeling much better and still has some cough.  He reports that he did have COVID-19 testing while hospitalized at [facility].  AKA Eric Leach [facility]  Depression screen [facility] 2/9 09/19/2018  Decreased Interest 0  Down, Depressed, Hopeless 0  PHQ - 2 Score 0    History Eric Leach has a past medical history of Hypertension.   He has a past surgical history that includes Lung surgery and Knee arthroscopy.   His family history includes Cancer in his father and mother; Diabetes in his father; Hypertension in his mother.He reports that he has been smoking cigarettes. He has never used smokeless tobacco. He reports current alcohol use. He reports that he does not use drugs.    ROS Review of Systems  Constitutional: Negative.   HENT: Negative.   Eyes: Negative for visual disturbance.  Respiratory: Negative for cough and shortness of breath.   Cardiovascular: Negative for chest pain and leg swelling.  Gastrointestinal: Negative for abdominal pain, diarrhea, nausea and vomiting.  Genitourinary: Negative for difficulty urinating.  Musculoskeletal: Negative for arthralgias and myalgias.  Skin: Negative for rash.  Neurological: Negative for headaches.  Psychiatric/Behavioral: Negative for sleep disturbance.    Objective:  BP (!) 145/87    Pulse 74    Temp (!) 97.2 F (36.2 C) (Oral)    Ht 5\' 11"  (1.803 m)  Wt 155 lb 3.2 oz (70.4 kg)    BMI 21.65 kg/m   BP Readings from Last 3 Encounters:  09/19/18 (!) 145/87  10/05/17 (!) 167/79  02/19/16 (!) 178/102    Wt Readings from Last 3 Encounters:  09/19/18 155 lb 3.2 oz (70.4 kg)  10/05/17 145 lb (65.8 kg)  09/28/13 185 lb (83.9 kg)     Physical Exam Constitutional:      General: He is not in acute distress.    Appearance: He is well-developed.  HENT:     Head: Normocephalic and atraumatic.     Right Ear: External ear normal.     Left Ear: External ear normal.     Nose: Nose  normal.  Eyes:     Conjunctiva/sclera: Conjunctivae normal.     Pupils: Pupils are equal, round, and reactive to light.  Neck:     Musculoskeletal: Normal range of motion and neck supple.  Cardiovascular:     Rate and Rhythm: Normal rate and regular rhythm.     Heart sounds: Normal heart sounds. No murmur.  Pulmonary:     Effort: Pulmonary effort is normal. No respiratory distress.     Breath sounds: Rhonchi (Throughout the left lung fields) and rales (  Left base) present. No wheezing.  Abdominal:     Palpations: Abdomen is soft.     Tenderness: There is no abdominal tenderness.  Musculoskeletal: Normal range of motion.  Skin:    General: Skin is warm and dry.  Neurological:     Mental Status: He is alert and oriented to person, place, and time.     Deep Tendon Reflexes: Reflexes are normal and symmetric.  Psychiatric:        Behavior: Behavior normal.        Thought Content: Thought content normal.        Judgment: Judgment normal.       Assessment & Plan:   Eric Leach was seen today for new patient (initial visit) and referral for ct of lungs.  Diagnoses and all orders for this visit:  Carcinoma, lung, left ([place]) -     CT Chest Wo Contrast; Future -     Ambulatory referral to Oncology  Erectile dysfunction, unspecified erectile dysfunction type -     sildenafil (VIAGRA) 100 MG tablet; Take 0.5-1 tablets (50-100 mg total) by mouth daily as needed for erectile dysfunction (sex).  Essential hypertension -     cloNIDine (CATAPRES) 0.1 MG tablet; Take 1 tablet (0.1 mg total) by mouth 2 (two) times daily. For Blood Pressure -     lisinopril-hydrochlorothiazide (ZESTORETIC) 10-12.5 MG tablet; Take 1 tablet by mouth daily. For blood pressure       I have discontinued [name]. [name]'s lisinopril-hydrochlorothiazide, levofloxacin, cloNIDine, and lisinopril. I am also having him start on cloNIDine, lisinopril-hydrochlorothiazide, and sildenafil.  Allergies as of  09/19/2018      Reactions   Sulfa Antibiotics       Medication List       Accurate as of Sep 19, 2018  2:08 PM. If you have any questions, ask your nurse or doctor.        STOP taking these medications   levofloxacin 750 MG tablet Commonly known as:  LEVAQUIN Stopped by:  [name], MD   lisinopril 5 MG tablet Commonly known as:  ZESTRIL Stopped by:  [name], MD   lisinopril-hydrochlorothiazide 20-12.5 MG tablet Commonly known as:  ZESTORETIC Replaced by:  lisinopril-hydrochlorothiazide 10-12.5 MG tablet Stopped by:  [name], MD     TAKE these medications   cloNIDine 0.1 MG tablet Commonly known as:  CATAPRES Take 1 tablet (0.1 mg total) by mouth 2 (two) times daily. For Blood Pressure What changed:    how much to take  when to take this  additional instructions Changed by:  [name], MD   lisinopril-hydrochlorothiazide 10-12.5 MG tablet Commonly known as:  ZESTORETIC Take 1 tablet by mouth daily. For blood pressure Replaces:  lisinopril-hydrochlorothiazide 20-12.5 MG tablet Started by:  [name], MD   sildenafil 100 MG tablet Commonly known as:  Viagra Take 0.5-1 tablets (50-100 mg total) by mouth daily as needed for erectile dysfunction (sex). Started by:  [name], MD        Follow-up: Return in about 6 months (around 03/22/2019).  [name], M.D.

## 2018-09-20 NOTE — Telephone Encounter (Signed)
Noted Forestine Na appointment scheduled 09-26-2018.

## 2018-09-21 ENCOUNTER — Encounter (HOSPITAL_COMMUNITY): Payer: Self-pay | Admitting: *Deleted

## 2018-09-21 NOTE — Progress Notes (Signed)
Oncology Navigator Note:  Patient was referred to our office by Dr. Quinn Axe. He has history of lung cancer.  I called patient today to introduce myself and provide information in how I will be involved with their care.  I provided information on their first visit and what to expect.  I made sure patient was aware of appointment time and directions to the cancer center.  My phone number was given so that he can call me with any questions or concerns.  He voices appreciation and understanding.

## 2018-09-25 ENCOUNTER — Other Ambulatory Visit: Payer: Self-pay

## 2018-09-26 ENCOUNTER — Inpatient Hospital Stay (HOSPITAL_COMMUNITY): Payer: Medicare HMO

## 2018-09-26 ENCOUNTER — Encounter (HOSPITAL_COMMUNITY): Payer: Self-pay | Admitting: Hematology

## 2018-09-26 ENCOUNTER — Inpatient Hospital Stay (HOSPITAL_COMMUNITY): Payer: Medicare HMO | Attending: Hematology | Admitting: Hematology

## 2018-09-26 VITALS — BP 121/83 | HR 109 | Temp 98.0°F | Resp 16 | Wt 153.6 lb

## 2018-09-26 DIAGNOSIS — Z85118 Personal history of other malignant neoplasm of bronchus and lung: Secondary | ICD-10-CM

## 2018-09-26 DIAGNOSIS — C3492 Malignant neoplasm of unspecified part of left bronchus or lung: Secondary | ICD-10-CM

## 2018-09-26 DIAGNOSIS — R918 Other nonspecific abnormal finding of lung field: Secondary | ICD-10-CM

## 2018-09-26 DIAGNOSIS — R69 Illness, unspecified: Secondary | ICD-10-CM | POA: Diagnosis not present

## 2018-09-26 DIAGNOSIS — F17210 Nicotine dependence, cigarettes, uncomplicated: Secondary | ICD-10-CM | POA: Diagnosis not present

## 2018-09-26 LAB — CBC WITH DIFFERENTIAL/PLATELET
Abs Immature Granulocytes: 0.04 10*3/uL (ref 0.00–0.07)
Basophils Absolute: 0.1 10*3/uL (ref 0.0–0.1)
Basophils Relative: 1 %
Eosinophils Absolute: 0.1 10*3/uL (ref 0.0–0.5)
Eosinophils Relative: 1 %
HCT: 42.9 % (ref 39.0–52.0)
Hemoglobin: 14.1 g/dL (ref 13.0–17.0)
Immature Granulocytes: 1 %
Lymphocytes Relative: 25 %
Lymphs Abs: 2.3 10*3/uL (ref 0.7–4.0)
MCH: 29.3 pg (ref 26.0–34.0)
MCHC: 32.9 g/dL (ref 30.0–36.0)
MCV: 89.2 fL (ref 80.0–100.0)
Monocytes Absolute: 1 10*3/uL (ref 0.1–1.0)
Monocytes Relative: 12 %
Neutro Abs: 5.4 10*3/uL (ref 1.7–7.7)
Neutrophils Relative %: 60 %
Platelets: 243 10*3/uL (ref 150–400)
RBC: 4.81 MIL/uL (ref 4.22–5.81)
RDW: 14.8 % (ref 11.5–15.5)
WBC: 8.9 10*3/uL (ref 4.0–10.5)
nRBC: 0 % (ref 0.0–0.2)

## 2018-09-26 LAB — COMPREHENSIVE METABOLIC PANEL
ALT: 17 U/L (ref 0–44)
AST: 15 U/L (ref 15–41)
Albumin: 3.9 g/dL (ref 3.5–5.0)
Alkaline Phosphatase: 82 U/L (ref 38–126)
Anion gap: 13 (ref 5–15)
BUN: 15 mg/dL (ref 8–23)
CO2: 28 mmol/L (ref 22–32)
Calcium: 9.5 mg/dL (ref 8.9–10.3)
Chloride: 100 mmol/L (ref 98–111)
Creatinine, Ser: 1.01 mg/dL (ref 0.61–1.24)
GFR calc Af Amer: >60 mL/min (ref >60)
GFR calc non Af Amer: >60 mL/min (ref >60)
Glucose, Bld: 96 mg/dL (ref 70–99)
Potassium: 3.9 mmol/L (ref 3.5–5.1)
Sodium: 141 mmol/L (ref 135–145)
Total Bilirubin: 0.5 mg/dL (ref 0.3–1.2)
Total Protein: 7.6 g/dL (ref 6.5–8.1)

## 2018-09-26 NOTE — Progress Notes (Signed)
[facility] NOTE  Patient Care Team: [name], MD as PCP - General (Family Medicine)  CHIEF COMPLAINTS/PURPOSE OF CONSULTATION:  History of left lung cancer.  HISTORY OF PRESENTING ILLNESS:  Eric Leach 68 y.o. male is seen in consultation today for further work-up and management of history of left lung cancer.  He has remote history of left lung cancer, resected prior to 2005 in [facility].  He apparently received chemo and radiation therapy.  He reports having an episode of pneumonia 2 weeks ago and was treated with antibiotics.  He has lost some weight but had gained it back.  Denies any fevers or night sweats.  No new pains.  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  He also worked at [employer].  He is continuing to smoke about 3/4 pack/day for the past 20 years.  Denies any change in bowel habits.  He had a CT scan of the chest done on 10/05/2017 which showed left upper lobe infiltrate with cavitary lesion.  No mediastinal mass or adenopathy.  Right lung was very clear.  Denies any change in bowel habits. Family history significant for mother with breast cancer in her 10s.  Father had squamous cell carcinoma of the scalp skin.  MEDICAL HISTORY:  Past Medical History:  Diagnosis Date  . COPD (chronic obstructive pulmonary disease) ([place])   . Hypertension   . Lung cancer [facility])     SURGICAL HISTORY: Past Surgical History:  Procedure Laterality Date  . KNEE ARTHROSCOPY    . LUNG SURGERY      SOCIAL HISTORY: Social History   Socioeconomic History  . Marital status: Divorced    Spouse name: Not on file  . Number of children: Not on file  . Years of education: Not on file  . Highest education level: Not on file  Occupational History  . Not on file  Social Needs  . Financial resource strain: Not on file  . Food insecurity:    Worry: Not on file    Inability: Not on file  . Transportation needs:    Medical: Not on  file    Non-medical: Not on file  Tobacco Use  . Smoking status: Current Every Day Smoker    Types: Cigarettes  . Smokeless tobacco: Never Used  Substance and Sexual Activity  . Alcohol use: Yes    Comment: Very little.  . Drug use: No  . Sexual activity: Not on file  Lifestyle  . Physical activity:    Days per week: Not on file    Minutes per session: Not on file  . Stress: Not on file  Relationships  . Social connections:    Talks on phone: Not on file    Gets together: Not on file    Attends religious service: Not on file    Active member of club or organization: Not on file    Attends meetings of clubs or organizations: Not on file    Relationship status: Not on file  . Intimate partner violence:    Fear of current or ex partner: Not on file    Emotionally abused: Not on file    Physically abused: Not on file    Forced sexual activity: Not on file  Other Topics Concern  . Not on file  Social History Narrative  . Not on file    FAMILY HISTORY: Family History  Problem Relation Age of Onset  . Hypertension Mother   .  Cancer Mother        breast  . Cancer Father   . Diabetes Father     ALLERGIES:  is allergic to sulfa antibiotics.  MEDICATIONS:  Current Outpatient Medications  Medication Sig Dispense Refill  . lisinopril-hydrochlorothiazide (ZESTORETIC) 10-12.5 MG tablet Take 1 tablet by mouth daily. For blood pressure 90 tablet 3  . sildenafil (VIAGRA) 100 MG tablet Take 0.5-1 tablets (50-100 mg total) by mouth daily as needed for erectile dysfunction (sex). 8 tablet 11  . cloNIDine (CATAPRES) 0.1 MG tablet Take 1 tablet (0.1 mg total) by mouth 2 (two) times daily. For Blood Pressure (Patient not taking: Reported on 09/26/2018) 60 tablet 5   No current facility-administered medications for this visit.     REVIEW OF SYSTEMS:   Constitutional: Denies fevers, chills or abnormal night sweats Eyes: Denies blurriness of vision, double vision or watery eyes Ears,  nose, mouth, throat, and face: Denies mucositis or sore throat Respiratory: Denies cough, dyspnea or wheezes Cardiovascular: Denies palpitation, chest discomfort or lower extremity swelling Gastrointestinal:  Denies nausea, heartburn or change in bowel habits Skin: Denies abnormal skin rashes Lymphatics: Denies new lymphadenopathy or easy bruising Neurological:Denies numbness, tingling or new weaknesses Behavioral/Psych: Mood is stable, no new changes  All other systems were reviewed with the patient and are negative.  PHYSICAL EXAMINATION: ECOG PERFORMANCE STATUS: 1 - Symptomatic but completely ambulatory  Vitals:   09/26/18 1300  BP: 121/83  Pulse: (!) 109  Resp: 16  Temp: 98 F (36.7 C)  SpO2: 99%   Filed Weights   09/26/18 1300  Weight: 153 lb 9 oz (69.7 kg)    GENERAL:alert, no distress and comfortable SKIN: skin color, texture, turgor are normal, no rashes or significant lesions EYES: normal, conjunctiva are pink and non-injected, sclera clear OROPHARYNX:no exudate, no erythema and lips, buccal mucosa, and tongue normal  NECK: supple, thyroid normal size, non-tender, without nodularity LYMPH:  no palpable lymphadenopathy in the cervical, axillary or inguinal LUNGS: clear to auscultation and percussion with normal breathing effort HEART: regular rate & rhythm and no murmurs and no lower extremity edema ABDOMEN:abdomen soft, non-tender and normal bowel sounds Musculoskeletal:no cyanosis of digits and no clubbing  PSYCH: alert & oriented x 3 with fluent speech NEURO: no focal motor/sensory deficits  LABORATORY DATA:  I have reviewed the data as listed Lab Results  Component Value Date   WBC 8.9 09/26/2018   HGB 14.1 09/26/2018   HCT 42.9 09/26/2018   MCV 89.2 09/26/2018   PLT 243 09/26/2018     Chemistry      Component Value Date/Time   NA 141 09/26/2018 1423   K 3.9 09/26/2018 1423   CL 100 09/26/2018 1423   CO2 28 09/26/2018 1423   BUN 15 09/26/2018 1423    CREATININE 1.01 09/26/2018 1423      Component Value Date/Time   CALCIUM 9.5 09/26/2018 1423   ALKPHOS 82 09/26/2018 1423   AST 15 09/26/2018 1423   ALT 17 09/26/2018 1423   BILITOT 0.5 09/26/2018 1423       RADIOGRAPHIC STUDIES: I have personally reviewed the radiological images as listed and agreed with the findings in the report.  ASSESSMENT & PLAN:  Carcinoma, lung, left ([place]) 1.  History of left lung cancer: - He had a history of left lung cancer, resected prior to 2005, followed by chemoradiation therapy in [facility]. - He is continuing to smoke about 3/4 pack/day for at least 20 years. - He reportedly  had pneumonia 2 weeks ago. -I have reviewed last CT scan from 10/05/2017 which showed extensive density within the left upper lobe including some cavitation at the apex.  No mediastinal or hilar adenopathy.  Right lung is clean. -He denies any fevers, night sweats or significant weight loss.  No new chest pains were reported. - I have recommended doing a CT of the chest with contrast given the findings on the previous scan and his history. -We had extensively talked about smoking cessation.  I have offered him Chantix.  However he does not want to try that as it may affect his mind.  He said he would try nicotine patches. - I will see him back after the CT scan.  Orders Placed This Encounter  Procedures  . CT Chest W Contrast    Standing Status:   Future    Standing Expiration Date:   09/26/2019    Order Specific Question:   ** REASON FOR EXAM (FREE TEXT)    Answer:   Hx of lung cancer    Order Specific Question:   If indicated for the ordered procedure, I authorize the administration of contrast media per Radiology protocol    Answer:   Yes    Order Specific Question:   Preferred imaging location?    Answer:   [facility]    Order Specific Question:   Radiology Contrast Protocol - do NOT remove file path    Answer:    [email or web address]  . CBC with Differential/Platelet    Standing Status:   Future    Number of Occurrences:   1    Standing Expiration Date:   09/26/2019  . Comprehensive metabolic panel    Standing Status:   Future    Number of Occurrences:   1    Standing Expiration Date:   09/26/2019    All questions were answered. The patient knows to call the clinic with any problems, questions or concerns.      Derek Jack, MD 09/26/2018 6:32 PM

## 2018-09-26 NOTE — Patient Instructions (Signed)
[facility] Discharge Instructions  You were seen today by Dr. Delton Coombes. He went over your history, family history and how you've been feeling lately. He will get a CT scan of your chest. He will see you back after your scan for follow up.   Thank you for choosing [place] at [facility] to provide your oncology and hematology care.  To afford each patient quality time with our provider, please arrive at least 15 minutes before your scheduled appointment time.   If you have a lab appointment with the [facility] please come in thru the  Main Entrance and check in at the main information desk  You need to re-schedule your appointment should you arrive 10 or more minutes late.  We strive to give you quality time with our providers, and arriving late affects you and other patients whose appointments are after yours.  Also, if you no show three or more times for appointments you may be dismissed from the clinic at the providers discretion.     Again, thank you for choosing [facility].  Our hope is that these requests will decrease the amount of time that you wait before being seen by our physicians.       _____________________________________________________________  Should you have questions after your visit to [facility], please contact our office at [phone] between the hours of 8:00 a.m. and 4:30 p.m.  Voicemails left after 4:00 p.m. will not be returned until the following business day.  For prescription refill requests, have your pharmacy contact our office and allow 72 hours.    Cancer Center Support Programs:   > Cancer Support Group  2nd Tuesday of the month 1pm-2pm, Journey Room

## 2018-09-26 NOTE — Assessment & Plan Note (Addendum)
1.  History of left lung cancer: - He had a history of left lung cancer, resected prior to 2005, followed by chemoradiation therapy in [facility]. - He is continuing to smoke about 3/4 pack/day for at least 20 years. - He reportedly had pneumonia 2 weeks ago. -I have reviewed last CT scan from 10/05/2017 which showed extensive density within the left upper lobe including some cavitation at the apex.  No mediastinal or hilar adenopathy.  Right lung is clean. -He denies any fevers, night sweats or significant weight loss.  No new chest pains were reported. - I have recommended doing a CT of the chest with contrast given the findings on the previous scan and his history. -We had extensively talked about smoking cessation.  I have offered him Chantix.  However he does not want to try that as it may affect his mind.  He said he would try nicotine patches. - I will see him back after the CT scan.

## 2018-10-09 ENCOUNTER — Ambulatory Visit (HOSPITAL_COMMUNITY): Payer: Medicare HMO

## 2018-10-11 ENCOUNTER — Ambulatory Visit (HOSPITAL_COMMUNITY): Payer: Medicare HMO | Admitting: Hematology

## 2018-10-24 ENCOUNTER — Ambulatory Visit (HOSPITAL_COMMUNITY)
Admission: RE | Admit: 2018-10-24 | Discharge: 2018-10-24 | Disposition: A | Discharge status: Home / Self Care | Payer: Medicare HMO | Source: Ambulatory Visit | Attending: Hematology | Admitting: Hematology

## 2018-10-24 ENCOUNTER — Other Ambulatory Visit: Payer: Self-pay

## 2018-10-24 DIAGNOSIS — J432 Centrilobular emphysema: Secondary | ICD-10-CM | POA: Diagnosis not present

## 2018-10-24 DIAGNOSIS — Z85118 Personal history of other malignant neoplasm of bronchus and lung: Secondary | ICD-10-CM | POA: Insufficient documentation

## 2018-10-24 MED ORDER — IOHEXOL 300 MG/ML  SOLN
75.0000 mL | Freq: Once | INTRAMUSCULAR | Status: AC | PRN
  Administered 2018-10-24: 75 mL via INTRAVENOUS

## 2018-10-29 ENCOUNTER — Encounter (HOSPITAL_COMMUNITY): Payer: Self-pay | Admitting: Hematology

## 2018-10-29 ENCOUNTER — Inpatient Hospital Stay (HOSPITAL_COMMUNITY): Payer: Medicare HMO | Attending: Hematology | Admitting: Hematology

## 2018-10-29 ENCOUNTER — Other Ambulatory Visit: Payer: Self-pay

## 2018-10-29 VITALS — BP 159/99 | HR 88 | Temp 97.7°F | Resp 16 | Wt 147.0 lb

## 2018-10-29 DIAGNOSIS — F17210 Nicotine dependence, cigarettes, uncomplicated: Secondary | ICD-10-CM | POA: Diagnosis not present

## 2018-10-29 DIAGNOSIS — R69 Illness, unspecified: Secondary | ICD-10-CM | POA: Diagnosis not present

## 2018-10-29 DIAGNOSIS — Z85118 Personal history of other malignant neoplasm of bronchus and lung: Secondary | ICD-10-CM

## 2018-10-29 DIAGNOSIS — R911 Solitary pulmonary nodule: Secondary | ICD-10-CM

## 2018-10-29 DIAGNOSIS — C3492 Malignant neoplasm of unspecified part of left bronchus or lung: Secondary | ICD-10-CM

## 2018-10-29 NOTE — Assessment & Plan Note (Addendum)
1.  History of left lung cancer: - He had a history of left lung cancer, resected prior to 2005, followed by chemoradiation therapy in [facility]. - He is continuing to smoke about 3/4 pack/day for at least 20 years. - He reportedly had pneumonia a few weeks ago and was admitted at [place] scan of the chest from 10/05/2017 showed extensive density within the left upper lobe including some cavitation at the apex.  No mediastinal or hilar adenopathy. -Denies any fevers, night sweats or weight loss.  No new chest pains reported. - CT chest demonstrates stable findings. There is a new irregular 2.9 cm nodule in the right lower lobe which is most likely secondary to bronchopneumonia.  Patient is recovering from a diagnosis of pneumonia where he spent several days in the ICU.  I will plan to repeat CT of chest in 3 months to further evaluate findings.  2.  Tobacco abuse: -We have extensively discussed smoking cessation.  I have even offered Chantix.  He does not want to try it as it may affect his mind.  He will try nicotine patches.

## 2018-10-29 NOTE — Patient Instructions (Addendum)
[facility] at [facility] Discharge Instructions  You were seen today by Dr. Katragadda. He went over your recent lab results. He will see you back in 3 months for labs and follow up.   Thank you for choosing [facility] at [facility] to provide your oncology and hematology care.  To afford each patient quality time with our provider, please arrive at least 15 minutes before your scheduled appointment time.   If you have a lab appointment with the Cancer Center please come in thru the  Main Entrance and check in at the main information desk  You need to re-schedule your appointment should you arrive 10 or more minutes late.  We strive to give you quality time with our providers, and arriving late affects you and other patients whose appointments are after yours.  Also, if you no show three or more times for appointments you may be dismissed from the clinic at the providers discretion.     Again, thank you for choosing [facility].  Our hope is that these requests will decrease the amount of time that you wait before being seen by our physicians.       _____________________________________________________________  Should you have questions after your visit to [facility], please contact our office at [phone] between the hours of 8:00 a.m. and 4:30 p.m.  Voicemails left after 4:00 p.m. will not be returned until the following business day.  For prescription refill requests, have your pharmacy contact our office and allow 72 hours.    Cancer Center Support Programs:   > Cancer Support Group  2nd Tuesday of the month 1pm-2pm, Journey Room    

## 2018-10-29 NOTE — Progress Notes (Signed)
[place], [place] [address]   CLINIC:  Medical Oncology/Hematology  PCP:  [name], MD [place] [address] [phone]   REASON FOR VISIT:  Follow-up for lung cancer.     INTERVAL HISTORY:  Eric Leach 68 y.o. male seen for follow-up of his lung cancer on the left side.  We have ordered a CT scan of his chest.  Denies any hemoptysis.  Continues to smoke cigarettes.  Reportedly had pneumonia recently and was admitted at [facility].  Appetite is 75%.  Energy levels are 100%.  Pain is 0.  He is continuing to smoke about 3/4 pack of cigarettes per day.  Denies any ER visits or hospitalizations.  Denies any nausea, vomiting, diarrhea or constipation.   REVIEW OF SYSTEMS:  Review of Systems  All other systems reviewed and are negative.    PAST MEDICAL/SURGICAL HISTORY:  Past Medical History:  Diagnosis Date  . COPD (chronic obstructive pulmonary disease) ([place])   . Hypertension   . Lung cancer [facility])    Past Surgical History:  Procedure Laterality Date  . KNEE ARTHROSCOPY    . LUNG SURGERY       SOCIAL HISTORY:  Social History   Socioeconomic History  . Marital status: Divorced    Spouse name: Not on file  . Number of children: Not on file  . Years of education: Not on file  . Highest education level: Not on file  Occupational History  . Not on file  Social Needs  . Financial resource strain: Not on file  . Food insecurity    Worry: Not on file    Inability: Not on file  . Transportation needs    Medical: Not on file    Non-medical: Not on file  Tobacco Use  . Smoking status: Current Every Day Smoker    Types: Cigarettes  . Smokeless tobacco: Never Used  Substance and Sexual Activity  . Alcohol use: Yes    Comment: Very little.  . Drug use: No  . Sexual activity: Not on file  Lifestyle  . Physical activity    Days per week: Not on file    Minutes per session: Not on file  . Stress: Not  on file  Relationships  . Social Herbalist on phone: Not on file    Gets together: Not on file    Attends religious service: Not on file    Active member of club or organization: Not on file    Attends meetings of clubs or organizations: Not on file    Relationship status: Not on file  . Intimate partner violence    Fear of current or ex partner: Not on file    Emotionally abused: Not on file    Physically abused: Not on file    Forced sexual activity: Not on file  Other Topics Concern  . Not on file  Social History Narrative  . Not on file    FAMILY HISTORY:  Family History  Problem Relation Age of Onset  . Hypertension Mother   . Cancer Mother        breast  . Cancer Father   . Diabetes Father     CURRENT MEDICATIONS:  Outpatient Encounter Medications as of 10/29/2018  Medication Sig  . lisinopril-hydrochlorothiazide (ZESTORETIC) 10-12.5 MG tablet Take 1 tablet by mouth daily. For blood pressure  . [DISCONTINUED] cloNIDine (CATAPRES) 0.1 MG tablet Take 1 tablet (0.1  mg total) by mouth 2 (two) times daily. For Blood Pressure  . sildenafil (VIAGRA) 100 MG tablet Take 0.5-1 tablets (50-100 mg total) by mouth daily as needed for erectile dysfunction (sex). (Patient not taking: Reported on 10/29/2018)   No facility-administered encounter medications on file as of 10/29/2018.     ALLERGIES:  Allergies  Allergen Reactions  . Sulfa Antibiotics      PHYSICAL EXAM:  ECOG Performance status: 1  Vitals:   10/29/18 1100  BP: (!) 159/99  Pulse: 88  Resp: 16  Temp: 97.7 F (36.5 C)  SpO2: 96%   Filed Weights   10/29/18 1100  Weight: 147 lb (66.7 kg)    Physical Exam Vitals signs reviewed.  Constitutional:      Appearance: Normal appearance.  Cardiovascular:     Rate and Rhythm: Normal rate and regular rhythm.     Heart sounds: Normal heart sounds.  Pulmonary:     Effort: Pulmonary effort is normal.     Breath sounds: Normal breath sounds.  Abdominal:      General: There is no distension.     Palpations: Abdomen is soft. There is no mass.  Musculoskeletal:        General: No swelling.  Skin:    General: Skin is warm.  Neurological:     General: No focal deficit present.     Mental Status: He is alert and oriented to person, place, and time.  Psychiatric:        Mood and Affect: Mood normal.        Behavior: Behavior normal.      LABORATORY DATA:  I have reviewed the labs as listed.  CBC    Component Value Date/Time   WBC 8.9 09/26/2018 1423   RBC 4.81 09/26/2018 1423   HGB 14.1 09/26/2018 1423   HGB 15.6 04/21/2006 1349   HCT 42.9 09/26/2018 1423   HCT 44.6 04/21/2006 1349   PLT 243 09/26/2018 1423   PLT 300 04/21/2006 1349   MCV 89.2 09/26/2018 1423   MCV 89.2 04/21/2006 1349   MCH 29.3 09/26/2018 1423   MCHC 32.9 09/26/2018 1423   RDW 14.8 09/26/2018 1423   RDW 13.3 04/21/2006 1349   LYMPHSABS 2.3 09/26/2018 1423   LYMPHSABS 2.2 04/21/2006 1349   MONOABS 1.0 09/26/2018 1423   MONOABS 0.8 04/21/2006 1349   EOSABS 0.1 09/26/2018 1423   EOSABS 0.2 04/21/2006 1349   BASOSABS 0.1 09/26/2018 1423   BASOSABS 0.1 04/21/2006 1349   CMP Latest Ref Rng & Units 09/26/2018 10/05/2017 02/09/2011  Glucose 70 - 99 mg/dL 96 173(H) 111(H)  BUN 8 - 23 mg/dL 15 10 14   Creatinine 0.61 - 1.24 mg/dL 1.01 0.82 0.99  Sodium 135 - 145 mmol/L 141 130(L) 141  Potassium 3.5 - 5.1 mmol/L 3.9 4.0 3.8  Chloride 98 - 111 mmol/L 100 93(L) 104  CO2 22 - 32 mmol/L 28 26 28   Calcium 8.9 - 10.3 mg/dL 9.5 9.2 9.5  Total Protein 6.5 - 8.1 g/dL 7.6 - -  Total Bilirubin 0.3 - 1.2 mg/dL 0.5 - -  Alkaline Phos 38 - 126 U/L 82 - -  AST 15 - 41 U/L 15 - -  ALT 0 - 44 U/L 17 - -       DIAGNOSTIC IMAGING:  I have independently reviewed the scans and discussed with the patient.   I have reviewed Venita Lick LPN's note and agree with the documentation.  I personally performed a face-to-face visit, made  revisions and my assessment and plan is as  follows.    ASSESSMENT & PLAN:   Carcinoma, lung, left ([place]) 1.  History of left lung cancer: - He had a history of left lung cancer, resected prior to 2005, followed by chemoradiation therapy in [facility]. - He is continuing to smoke about 3/4 pack/day for at least 20 years. - He reportedly had pneumonia 2 weeks ago. -I have reviewed last CT scan from 10/05/2017 which showed extensive density within the left upper lobe including some cavitation at the apex.  No mediastinal or hilar adenopathy.  Right lung is clean. -He denies any fevers, night sweats or significant weight loss.  No new chest pains were reported. - CT chest demonstrates stable findings. There is a new irregular 2.9 cm nodule in the right lower lobe which is most likely secondary to bronchopneumonia.  Patient is recovering from a diagnosis of pneumonia where he spent several days in the ICU.  I will plan to repeat CT of chest in 3 months to further evaluate findings.  2. Tobacco abuse: - We had extensively talked about smoking cessation.  I have offered him Chantix.  However he does not want to try that as it may affect his mind.  He said he would try nicotine patches.   Total time spent is 25 minutes with more than 50% of the time spent face-to-face discussing scan results, counseling to quit smoking, surveillance plan, and coordination of care.    Orders placed this encounter:  Orders Placed This Encounter  Procedures  . CT Chest W Contrast      Derek Jack, MD [place] [phone]

## 2018-10-30 ENCOUNTER — Encounter: Payer: Self-pay | Admitting: General Practice

## 2018-10-30 NOTE — Progress Notes (Signed)
[facility] CSW Progress Notes  MEssage from [facility] LPN - patient reports food insecurity.  Has lost 6 pounds and states he cannot afford food.  Attempted to call pt x2.  No answer and phone hung up without answer or VM.  Will continue to try to reach out w resources.  Edwyna Shell, LCSW Clinical Social Worker Phone:  [phone]

## 2018-11-01 ENCOUNTER — Encounter: Payer: Self-pay | Admitting: General Practice

## 2018-11-01 NOTE — Progress Notes (Signed)
[facility] CSW Progress Notes  Was able to speak directly w patient.  Pt reports that his income has been stressed by recent move into apartment w higher rent than prior residence as well as new care payment.  Is retired Pharmacist, hospital.  Is not eligible for Food Stamps as he is over income.  Would like information on food banks serving [facility].  Will send this information as well as information on [employer] which is the nonprofit serving cancer patients residing in [facility].  Also referred to [employer] for their gas card program to assist w gas needs.    Edwyna Shell, LCSW Clinical Social Worker Phone:  [phone]

## 2018-11-02 ENCOUNTER — Encounter: Payer: Self-pay | Admitting: General Practice

## 2018-11-28 DIAGNOSIS — B182 Chronic viral hepatitis C: Secondary | ICD-10-CM | POA: Diagnosis not present

## 2018-11-28 DIAGNOSIS — Z85118 Personal history of other malignant neoplasm of bronchus and lung: Secondary | ICD-10-CM | POA: Diagnosis not present

## 2018-11-28 DIAGNOSIS — J188 Other pneumonia, unspecified organism: Secondary | ICD-10-CM | POA: Diagnosis not present

## 2018-11-28 DIAGNOSIS — R042 Hemoptysis: Secondary | ICD-10-CM | POA: Diagnosis not present

## 2018-11-28 DIAGNOSIS — J449 Chronic obstructive pulmonary disease, unspecified: Secondary | ICD-10-CM | POA: Diagnosis not present

## 2018-11-28 DIAGNOSIS — A319 Mycobacterial infection, unspecified: Secondary | ICD-10-CM | POA: Diagnosis not present

## 2018-11-28 DIAGNOSIS — J431 Panlobular emphysema: Secondary | ICD-10-CM | POA: Diagnosis not present

## 2018-11-28 DIAGNOSIS — R05 Cough: Secondary | ICD-10-CM | POA: Diagnosis not present

## 2018-11-28 DIAGNOSIS — E871 Hypo-osmolality and hyponatremia: Secondary | ICD-10-CM | POA: Diagnosis not present

## 2018-11-28 DIAGNOSIS — I10 Essential (primary) hypertension: Secondary | ICD-10-CM | POA: Diagnosis not present

## 2018-11-28 DIAGNOSIS — J189 Pneumonia, unspecified organism: Secondary | ICD-10-CM | POA: Diagnosis not present

## 2018-11-28 DIAGNOSIS — R69 Illness, unspecified: Secondary | ICD-10-CM | POA: Diagnosis not present

## 2018-11-28 DIAGNOSIS — J984 Other disorders of lung: Secondary | ICD-10-CM | POA: Diagnosis not present

## 2018-11-28 DIAGNOSIS — R918 Other nonspecific abnormal finding of lung field: Secondary | ICD-10-CM | POA: Diagnosis not present

## 2018-11-28 DIAGNOSIS — Z20828 Contact with and (suspected) exposure to other viral communicable diseases: Secondary | ICD-10-CM | POA: Diagnosis not present

## 2018-11-28 DIAGNOSIS — R0602 Shortness of breath: Secondary | ICD-10-CM | POA: Diagnosis not present

## 2018-11-28 DIAGNOSIS — R634 Abnormal weight loss: Secondary | ICD-10-CM | POA: Diagnosis not present

## 2018-11-28 DIAGNOSIS — J439 Emphysema, unspecified: Secondary | ICD-10-CM | POA: Diagnosis not present

## 2018-11-28 DIAGNOSIS — E119 Type 2 diabetes mellitus without complications: Secondary | ICD-10-CM | POA: Diagnosis not present

## 2018-12-10 DIAGNOSIS — B182 Chronic viral hepatitis C: Secondary | ICD-10-CM | POA: Diagnosis not present

## 2018-12-10 DIAGNOSIS — J449 Chronic obstructive pulmonary disease, unspecified: Secondary | ICD-10-CM | POA: Diagnosis not present

## 2018-12-10 DIAGNOSIS — R042 Hemoptysis: Secondary | ICD-10-CM | POA: Diagnosis not present

## 2018-12-10 DIAGNOSIS — I10 Essential (primary) hypertension: Secondary | ICD-10-CM | POA: Diagnosis not present

## 2018-12-10 DIAGNOSIS — J189 Pneumonia, unspecified organism: Secondary | ICD-10-CM | POA: Diagnosis not present

## 2018-12-10 DIAGNOSIS — J984 Other disorders of lung: Secondary | ICD-10-CM | POA: Diagnosis not present

## 2018-12-10 DIAGNOSIS — A319 Mycobacterial infection, unspecified: Secondary | ICD-10-CM | POA: Diagnosis not present

## 2018-12-14 DIAGNOSIS — B182 Chronic viral hepatitis C: Secondary | ICD-10-CM | POA: Diagnosis not present

## 2018-12-14 DIAGNOSIS — J189 Pneumonia, unspecified organism: Secondary | ICD-10-CM | POA: Diagnosis not present

## 2018-12-14 DIAGNOSIS — A310 Pulmonary mycobacterial infection: Secondary | ICD-10-CM | POA: Diagnosis not present

## 2018-12-14 DIAGNOSIS — J984 Other disorders of lung: Secondary | ICD-10-CM | POA: Diagnosis not present

## 2018-12-27 DIAGNOSIS — R825 Elevated urine levels of drugs, medicaments and biological substances: Secondary | ICD-10-CM | POA: Diagnosis not present

## 2018-12-27 DIAGNOSIS — R69 Illness, unspecified: Secondary | ICD-10-CM | POA: Diagnosis not present

## 2019-01-03 DIAGNOSIS — K5900 Constipation, unspecified: Secondary | ICD-10-CM | POA: Diagnosis not present

## 2019-01-03 DIAGNOSIS — R69 Illness, unspecified: Secondary | ICD-10-CM | POA: Diagnosis not present

## 2019-01-03 DIAGNOSIS — A310 Pulmonary mycobacterial infection: Secondary | ICD-10-CM | POA: Diagnosis not present

## 2019-01-04 DIAGNOSIS — R825 Elevated urine levels of drugs, medicaments and biological substances: Secondary | ICD-10-CM | POA: Diagnosis not present

## 2019-01-04 DIAGNOSIS — R69 Illness, unspecified: Secondary | ICD-10-CM | POA: Diagnosis not present

## 2019-01-10 DIAGNOSIS — A310 Pulmonary mycobacterial infection: Secondary | ICD-10-CM | POA: Diagnosis not present

## 2019-01-10 DIAGNOSIS — K5900 Constipation, unspecified: Secondary | ICD-10-CM | POA: Diagnosis not present

## 2019-01-10 DIAGNOSIS — R69 Illness, unspecified: Secondary | ICD-10-CM | POA: Diagnosis not present

## 2019-01-11 DIAGNOSIS — K5900 Constipation, unspecified: Secondary | ICD-10-CM | POA: Diagnosis not present

## 2019-01-11 DIAGNOSIS — A310 Pulmonary mycobacterial infection: Secondary | ICD-10-CM | POA: Diagnosis not present

## 2019-01-11 DIAGNOSIS — R69 Illness, unspecified: Secondary | ICD-10-CM | POA: Diagnosis not present

## 2019-01-11 DIAGNOSIS — R825 Elevated urine levels of drugs, medicaments and biological substances: Secondary | ICD-10-CM | POA: Diagnosis not present

## 2019-01-16 DIAGNOSIS — B182 Chronic viral hepatitis C: Secondary | ICD-10-CM | POA: Diagnosis not present

## 2019-01-16 DIAGNOSIS — A310 Pulmonary mycobacterial infection: Secondary | ICD-10-CM | POA: Diagnosis not present

## 2019-01-16 DIAGNOSIS — R69 Illness, unspecified: Secondary | ICD-10-CM | POA: Diagnosis not present

## 2019-01-16 DIAGNOSIS — Z23 Encounter for immunization: Secondary | ICD-10-CM | POA: Diagnosis not present

## 2019-01-16 DIAGNOSIS — J984 Other disorders of lung: Secondary | ICD-10-CM | POA: Diagnosis not present

## 2019-01-16 DIAGNOSIS — J189 Pneumonia, unspecified organism: Secondary | ICD-10-CM | POA: Diagnosis not present

## 2019-01-17 DIAGNOSIS — A310 Pulmonary mycobacterial infection: Secondary | ICD-10-CM | POA: Diagnosis not present

## 2019-01-17 DIAGNOSIS — K5900 Constipation, unspecified: Secondary | ICD-10-CM | POA: Diagnosis not present

## 2019-01-17 DIAGNOSIS — R69 Illness, unspecified: Secondary | ICD-10-CM | POA: Diagnosis not present

## 2019-01-18 DIAGNOSIS — A310 Pulmonary mycobacterial infection: Secondary | ICD-10-CM | POA: Diagnosis not present

## 2019-01-18 DIAGNOSIS — R69 Illness, unspecified: Secondary | ICD-10-CM | POA: Diagnosis not present

## 2019-01-18 DIAGNOSIS — K5900 Constipation, unspecified: Secondary | ICD-10-CM | POA: Diagnosis not present

## 2019-01-24 DIAGNOSIS — R69 Illness, unspecified: Secondary | ICD-10-CM | POA: Diagnosis not present

## 2019-01-24 DIAGNOSIS — K5900 Constipation, unspecified: Secondary | ICD-10-CM | POA: Diagnosis not present

## 2019-01-24 DIAGNOSIS — A310 Pulmonary mycobacterial infection: Secondary | ICD-10-CM | POA: Diagnosis not present

## 2019-01-25 DIAGNOSIS — R69 Illness, unspecified: Secondary | ICD-10-CM | POA: Diagnosis not present

## 2019-01-25 DIAGNOSIS — A310 Pulmonary mycobacterial infection: Secondary | ICD-10-CM | POA: Diagnosis not present

## 2019-01-25 DIAGNOSIS — K5900 Constipation, unspecified: Secondary | ICD-10-CM | POA: Diagnosis not present

## 2019-01-29 ENCOUNTER — Other Ambulatory Visit (HOSPITAL_COMMUNITY): Payer: Self-pay

## 2019-01-29 DIAGNOSIS — Z85118 Personal history of other malignant neoplasm of bronchus and lung: Secondary | ICD-10-CM

## 2019-01-30 ENCOUNTER — Inpatient Hospital Stay (HOSPITAL_COMMUNITY): Payer: Medicare HMO | Attending: Hematology

## 2019-01-30 ENCOUNTER — Ambulatory Visit (HOSPITAL_COMMUNITY): Admission: RE | Admit: 2019-01-30 | Payer: Medicare HMO | Source: Ambulatory Visit

## 2019-01-31 DIAGNOSIS — A310 Pulmonary mycobacterial infection: Secondary | ICD-10-CM | POA: Diagnosis not present

## 2019-01-31 DIAGNOSIS — K5900 Constipation, unspecified: Secondary | ICD-10-CM | POA: Diagnosis not present

## 2019-01-31 DIAGNOSIS — R69 Illness, unspecified: Secondary | ICD-10-CM | POA: Diagnosis not present

## 2019-02-01 DIAGNOSIS — R69 Illness, unspecified: Secondary | ICD-10-CM | POA: Diagnosis not present

## 2019-02-01 DIAGNOSIS — K5900 Constipation, unspecified: Secondary | ICD-10-CM | POA: Diagnosis not present

## 2019-02-01 DIAGNOSIS — A310 Pulmonary mycobacterial infection: Secondary | ICD-10-CM | POA: Diagnosis not present

## 2019-02-06 ENCOUNTER — Ambulatory Visit (HOSPITAL_COMMUNITY): Payer: Medicare HMO | Admitting: Hematology

## 2019-02-07 DIAGNOSIS — A310 Pulmonary mycobacterial infection: Secondary | ICD-10-CM | POA: Diagnosis not present

## 2019-02-07 DIAGNOSIS — R69 Illness, unspecified: Secondary | ICD-10-CM | POA: Diagnosis not present

## 2019-02-07 DIAGNOSIS — K5900 Constipation, unspecified: Secondary | ICD-10-CM | POA: Diagnosis not present

## 2019-02-08 DIAGNOSIS — K5900 Constipation, unspecified: Secondary | ICD-10-CM | POA: Diagnosis not present

## 2019-02-08 DIAGNOSIS — A310 Pulmonary mycobacterial infection: Secondary | ICD-10-CM | POA: Diagnosis not present

## 2019-02-08 DIAGNOSIS — R69 Illness, unspecified: Secondary | ICD-10-CM | POA: Diagnosis not present

## 2019-02-14 DIAGNOSIS — R69 Illness, unspecified: Secondary | ICD-10-CM | POA: Diagnosis not present

## 2019-02-14 DIAGNOSIS — A310 Pulmonary mycobacterial infection: Secondary | ICD-10-CM | POA: Diagnosis not present

## 2019-02-14 DIAGNOSIS — K5900 Constipation, unspecified: Secondary | ICD-10-CM | POA: Diagnosis not present

## 2019-02-15 DIAGNOSIS — A310 Pulmonary mycobacterial infection: Secondary | ICD-10-CM | POA: Diagnosis not present

## 2019-02-15 DIAGNOSIS — K5900 Constipation, unspecified: Secondary | ICD-10-CM | POA: Diagnosis not present

## 2019-02-15 DIAGNOSIS — R69 Illness, unspecified: Secondary | ICD-10-CM | POA: Diagnosis not present

## 2019-02-18 DIAGNOSIS — R69 Illness, unspecified: Secondary | ICD-10-CM | POA: Diagnosis not present

## 2019-02-18 DIAGNOSIS — A310 Pulmonary mycobacterial infection: Secondary | ICD-10-CM | POA: Diagnosis not present

## 2019-02-21 DIAGNOSIS — R69 Illness, unspecified: Secondary | ICD-10-CM | POA: Diagnosis not present

## 2019-02-21 DIAGNOSIS — K5900 Constipation, unspecified: Secondary | ICD-10-CM | POA: Diagnosis not present

## 2019-02-21 DIAGNOSIS — A310 Pulmonary mycobacterial infection: Secondary | ICD-10-CM | POA: Diagnosis not present

## 2019-02-22 DIAGNOSIS — R69 Illness, unspecified: Secondary | ICD-10-CM | POA: Diagnosis not present

## 2019-02-22 DIAGNOSIS — K5900 Constipation, unspecified: Secondary | ICD-10-CM | POA: Diagnosis not present

## 2019-02-22 DIAGNOSIS — A310 Pulmonary mycobacterial infection: Secondary | ICD-10-CM | POA: Diagnosis not present

## 2019-02-28 DIAGNOSIS — R69 Illness, unspecified: Secondary | ICD-10-CM | POA: Diagnosis not present

## 2019-02-28 DIAGNOSIS — K5900 Constipation, unspecified: Secondary | ICD-10-CM | POA: Diagnosis not present

## 2019-02-28 DIAGNOSIS — A310 Pulmonary mycobacterial infection: Secondary | ICD-10-CM | POA: Diagnosis not present

## 2019-03-07 DIAGNOSIS — R69 Illness, unspecified: Secondary | ICD-10-CM | POA: Diagnosis not present

## 2019-03-07 DIAGNOSIS — K5900 Constipation, unspecified: Secondary | ICD-10-CM | POA: Diagnosis not present

## 2019-03-07 DIAGNOSIS — A310 Pulmonary mycobacterial infection: Secondary | ICD-10-CM | POA: Diagnosis not present

## 2019-03-08 DIAGNOSIS — K5900 Constipation, unspecified: Secondary | ICD-10-CM | POA: Diagnosis not present

## 2019-03-08 DIAGNOSIS — A310 Pulmonary mycobacterial infection: Secondary | ICD-10-CM | POA: Diagnosis not present

## 2019-03-08 DIAGNOSIS — R69 Illness, unspecified: Secondary | ICD-10-CM | POA: Diagnosis not present

## 2019-03-12 ENCOUNTER — Encounter: Payer: Self-pay | Admitting: Family Medicine

## 2019-03-12 ENCOUNTER — Ambulatory Visit (INDEPENDENT_AMBULATORY_CARE_PROVIDER_SITE_OTHER): Payer: Medicare HMO | Admitting: Family Medicine

## 2019-03-12 DIAGNOSIS — A310 Pulmonary mycobacterial infection: Secondary | ICD-10-CM | POA: Diagnosis not present

## 2019-03-12 DIAGNOSIS — J418 Mixed simple and mucopurulent chronic bronchitis: Secondary | ICD-10-CM

## 2019-03-12 DIAGNOSIS — R69 Illness, unspecified: Secondary | ICD-10-CM | POA: Diagnosis not present

## 2019-03-12 MED ORDER — ALBUTEROL SULFATE (2.5 MG/3ML) 0.083% IN NEBU: 2.5000 mg | INHALATION_SOLUTION | Freq: Four times a day (QID) | RESPIRATORY_TRACT | 1 refills | Status: DC | PRN

## 2019-03-12 MED ORDER — NEBULIZER MISC: 0 refills | Status: DC

## 2019-03-12 NOTE — Progress Notes (Signed)
Subjective:    Patient ID: Eric Leach, male    DOB: October 17, 1950, 68 y.o.   MRN: [id number]   HPI: Eric Leach is a 68 y.o. male presenting for COPd. Needs albuterol inhaler. Needs nebulizer.Went through multiple scans and his caner eval was negative. His eventual diagnosis was MAC which is now being treated by Dr. Deatra Ina of INfectious disease. He also has COPD and has dyspnea. He needs albuterol by nebulization due to limited tidal volume and FVC.  presents for  follow-up of hypertension. Patient has no history of headache chest pain or shortness of breath or recent cough. Patient also denies symptoms of TIA such as focal numbness or weakness. Patient denies side effects from medication. States taking it regularly.    Depression screen PHQ 2/9 09/19/2018  Decreased Interest 0  Down, Depressed, Hopeless 0  PHQ - 2 Score 0     Relevant past medical, surgical, family and social history reviewed and updated as indicated.  Interim medical history since our last visit reviewed. Allergies and medications reviewed and updated.  ROS:  Review of Systems  Constitutional: Positive for fatigue. Negative for fever.  HENT: Negative.   Respiratory: Positive for cough and shortness of breath.   Cardiovascular: Negative for chest pain.  Musculoskeletal: Negative for arthralgias.  Skin: Negative for rash.     Social History   Tobacco Use  Smoking Status Current Every Day Smoker  . Types: Cigarettes  Smokeless Tobacco Never Used       Objective:     Wt Readings from Last 3 Encounters:  10/29/18 147 lb (66.7 kg)  09/26/18 153 lb 9 oz (69.7 kg)  09/19/18 155 lb 3.2 oz (70.4 kg)     Exam deferred. Pt. Harboring due to COVID 19. Phone visit performed.   Assessment & Plan:   1. Mycobacterium avium complex ([place])   2. Mixed simple and mucopurulent chronic bronchitis ([place])     Meds ordered this encounter  Medications  . Nebulizer MISC    Sig: Use with albuterol  QID. Dx: A31.0, J41.8    Dispense:  1 each    Refill:  0    Dx: A31.0, J41.8  . albuterol (PROVENTIL) (2.5 MG/3ML) 0.083% nebulizer solution    Sig: Take 3 mLs (2.5 mg total) by nebulization every 6 (six) hours as needed for wheezing or shortness of breath.    Dispense:  150 mL    Refill:  1    No orders of the defined types were placed in this encounter.     Diagnoses and all orders for this visit:  Mycobacterium avium complex ([place])  Mixed simple and mucopurulent chronic bronchitis ([place])  Other orders -     Nebulizer MISC; Use with albuterol QID. Dx: A31.0, J41.8 -     albuterol (PROVENTIL) (2.5 MG/3ML) 0.083% nebulizer solution; Take 3 mLs (2.5 mg total) by nebulization every 6 (six) hours as needed for wheezing or shortness of breath.    Virtual Visit via telephone Note  I discussed the limitations, risks, security and privacy concerns of performing an evaluation and management service by telephone and the availability of in person appointments. The patient was identified with two identifiers. Pt.expressed understanding and agreed to proceed. Pt. Is at home. Dr. Livia Snellen is in his office.  Follow Up Instructions:   I discussed the assessment and treatment plan with the patient. The patient was provided an opportunity to ask questions and all were answered. The patient agreed  with the plan and demonstrated an understanding of the instructions.   The patient was advised to call back or seek an in-person evaluation if the symptoms worsen or if the condition fails to improve as anticipated.   Total minutes including chart review and phone contact time: 18   Follow up plan: Return in about 6 months (around 09/09/2019).  [name], MD [place]

## 2019-03-14 DIAGNOSIS — K5900 Constipation, unspecified: Secondary | ICD-10-CM | POA: Diagnosis not present

## 2019-03-14 DIAGNOSIS — R69 Illness, unspecified: Secondary | ICD-10-CM | POA: Diagnosis not present

## 2019-03-14 DIAGNOSIS — A310 Pulmonary mycobacterial infection: Secondary | ICD-10-CM | POA: Diagnosis not present

## 2019-03-15 DIAGNOSIS — A310 Pulmonary mycobacterial infection: Secondary | ICD-10-CM | POA: Diagnosis not present

## 2019-03-15 DIAGNOSIS — R69 Illness, unspecified: Secondary | ICD-10-CM | POA: Diagnosis not present

## 2019-03-15 DIAGNOSIS — K5900 Constipation, unspecified: Secondary | ICD-10-CM | POA: Diagnosis not present

## 2019-03-20 DIAGNOSIS — A310 Pulmonary mycobacterial infection: Secondary | ICD-10-CM | POA: Diagnosis not present

## 2019-03-20 DIAGNOSIS — R69 Illness, unspecified: Secondary | ICD-10-CM | POA: Diagnosis not present

## 2019-03-20 DIAGNOSIS — K5900 Constipation, unspecified: Secondary | ICD-10-CM | POA: Diagnosis not present

## 2019-03-22 DIAGNOSIS — K5900 Constipation, unspecified: Secondary | ICD-10-CM | POA: Diagnosis not present

## 2019-03-22 DIAGNOSIS — R69 Illness, unspecified: Secondary | ICD-10-CM | POA: Diagnosis not present

## 2019-03-22 DIAGNOSIS — A310 Pulmonary mycobacterial infection: Secondary | ICD-10-CM | POA: Diagnosis not present

## 2019-03-29 DIAGNOSIS — K5900 Constipation, unspecified: Secondary | ICD-10-CM | POA: Diagnosis not present

## 2019-03-29 DIAGNOSIS — A310 Pulmonary mycobacterial infection: Secondary | ICD-10-CM | POA: Diagnosis not present

## 2019-03-29 DIAGNOSIS — R69 Illness, unspecified: Secondary | ICD-10-CM | POA: Diagnosis not present

## 2019-04-04 DIAGNOSIS — A310 Pulmonary mycobacterial infection: Secondary | ICD-10-CM | POA: Diagnosis not present

## 2019-04-04 DIAGNOSIS — K5900 Constipation, unspecified: Secondary | ICD-10-CM | POA: Diagnosis not present

## 2019-04-04 DIAGNOSIS — R69 Illness, unspecified: Secondary | ICD-10-CM | POA: Diagnosis not present

## 2019-04-05 DIAGNOSIS — R69 Illness, unspecified: Secondary | ICD-10-CM | POA: Diagnosis not present

## 2019-04-05 DIAGNOSIS — K5900 Constipation, unspecified: Secondary | ICD-10-CM | POA: Diagnosis not present

## 2019-04-05 DIAGNOSIS — A310 Pulmonary mycobacterial infection: Secondary | ICD-10-CM | POA: Diagnosis not present

## 2019-04-11 DIAGNOSIS — A310 Pulmonary mycobacterial infection: Secondary | ICD-10-CM | POA: Diagnosis not present

## 2019-04-11 DIAGNOSIS — K5900 Constipation, unspecified: Secondary | ICD-10-CM | POA: Diagnosis not present

## 2019-04-11 DIAGNOSIS — R69 Illness, unspecified: Secondary | ICD-10-CM | POA: Diagnosis not present

## 2019-04-17 DIAGNOSIS — K5900 Constipation, unspecified: Secondary | ICD-10-CM | POA: Diagnosis not present

## 2019-04-17 DIAGNOSIS — R69 Illness, unspecified: Secondary | ICD-10-CM | POA: Diagnosis not present

## 2019-04-17 DIAGNOSIS — A310 Pulmonary mycobacterial infection: Secondary | ICD-10-CM | POA: Diagnosis not present

## 2019-04-22 DIAGNOSIS — K5900 Constipation, unspecified: Secondary | ICD-10-CM | POA: Diagnosis not present

## 2019-04-22 DIAGNOSIS — R69 Illness, unspecified: Secondary | ICD-10-CM | POA: Diagnosis not present

## 2019-04-22 DIAGNOSIS — A310 Pulmonary mycobacterial infection: Secondary | ICD-10-CM | POA: Diagnosis not present

## 2019-04-23 DIAGNOSIS — K5900 Constipation, unspecified: Secondary | ICD-10-CM | POA: Diagnosis not present

## 2019-04-23 DIAGNOSIS — A310 Pulmonary mycobacterial infection: Secondary | ICD-10-CM | POA: Diagnosis not present

## 2019-04-23 DIAGNOSIS — R69 Illness, unspecified: Secondary | ICD-10-CM | POA: Diagnosis not present

## 2019-05-21 IMAGING — DX DG CHEST 2V
3 series · 3 of 3 positions shown · non-contrast
Comparison: Chest x-ray of 06/12/2014 and CT chest of 03/17/2006

CLINICAL DATA: Hiccups for 2-3 days, history of left lung squamous
cell carcinoma with chemotherapy and radiation therapy

EXAM:
CHEST - 2 VIEW

[chest ap (1 of 2)]
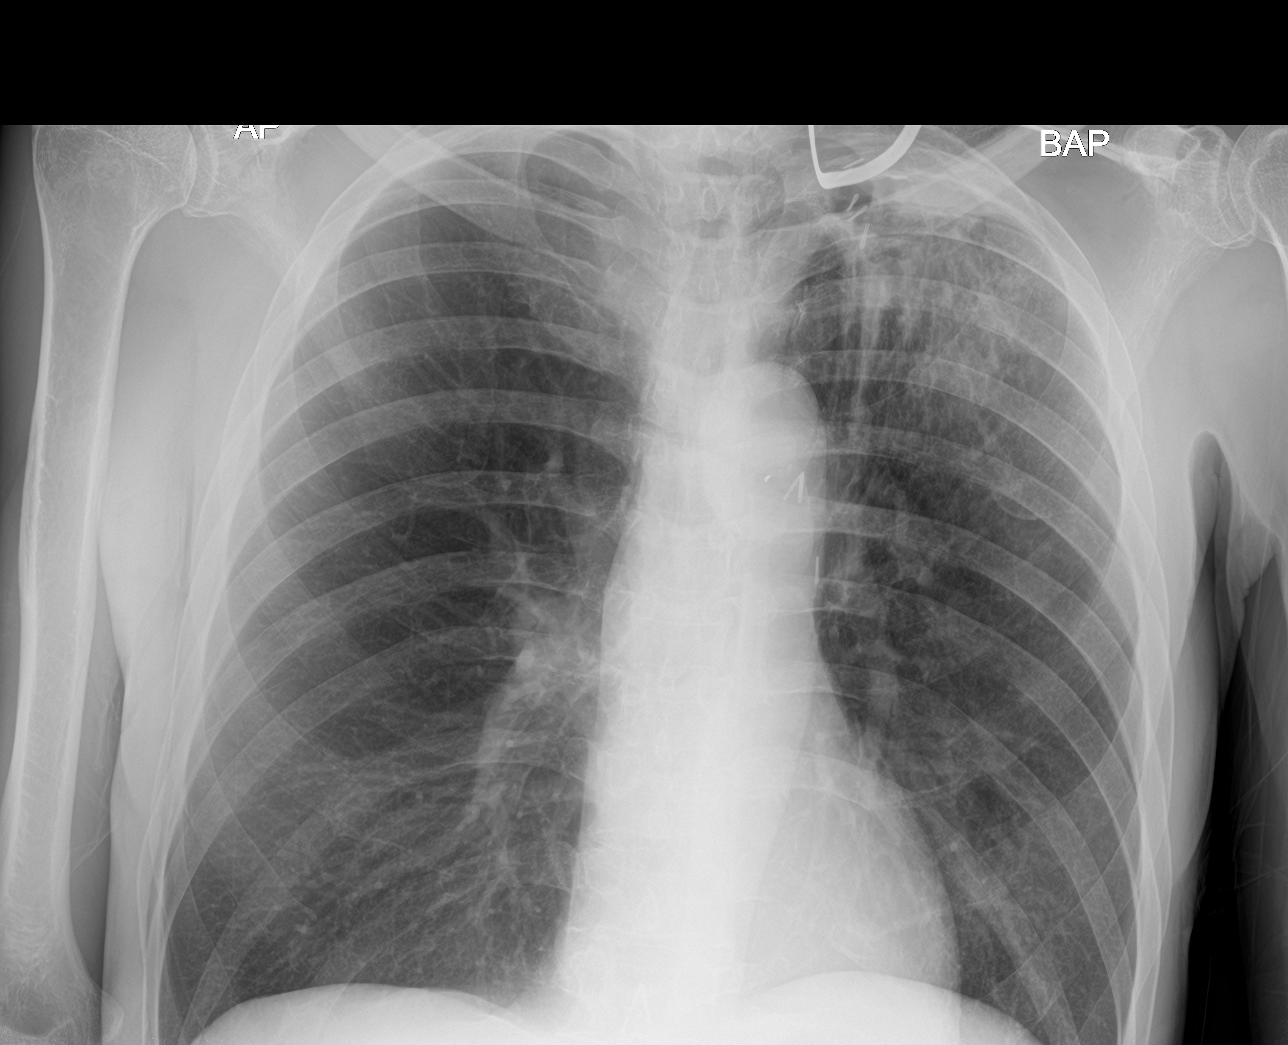

[chest lat]
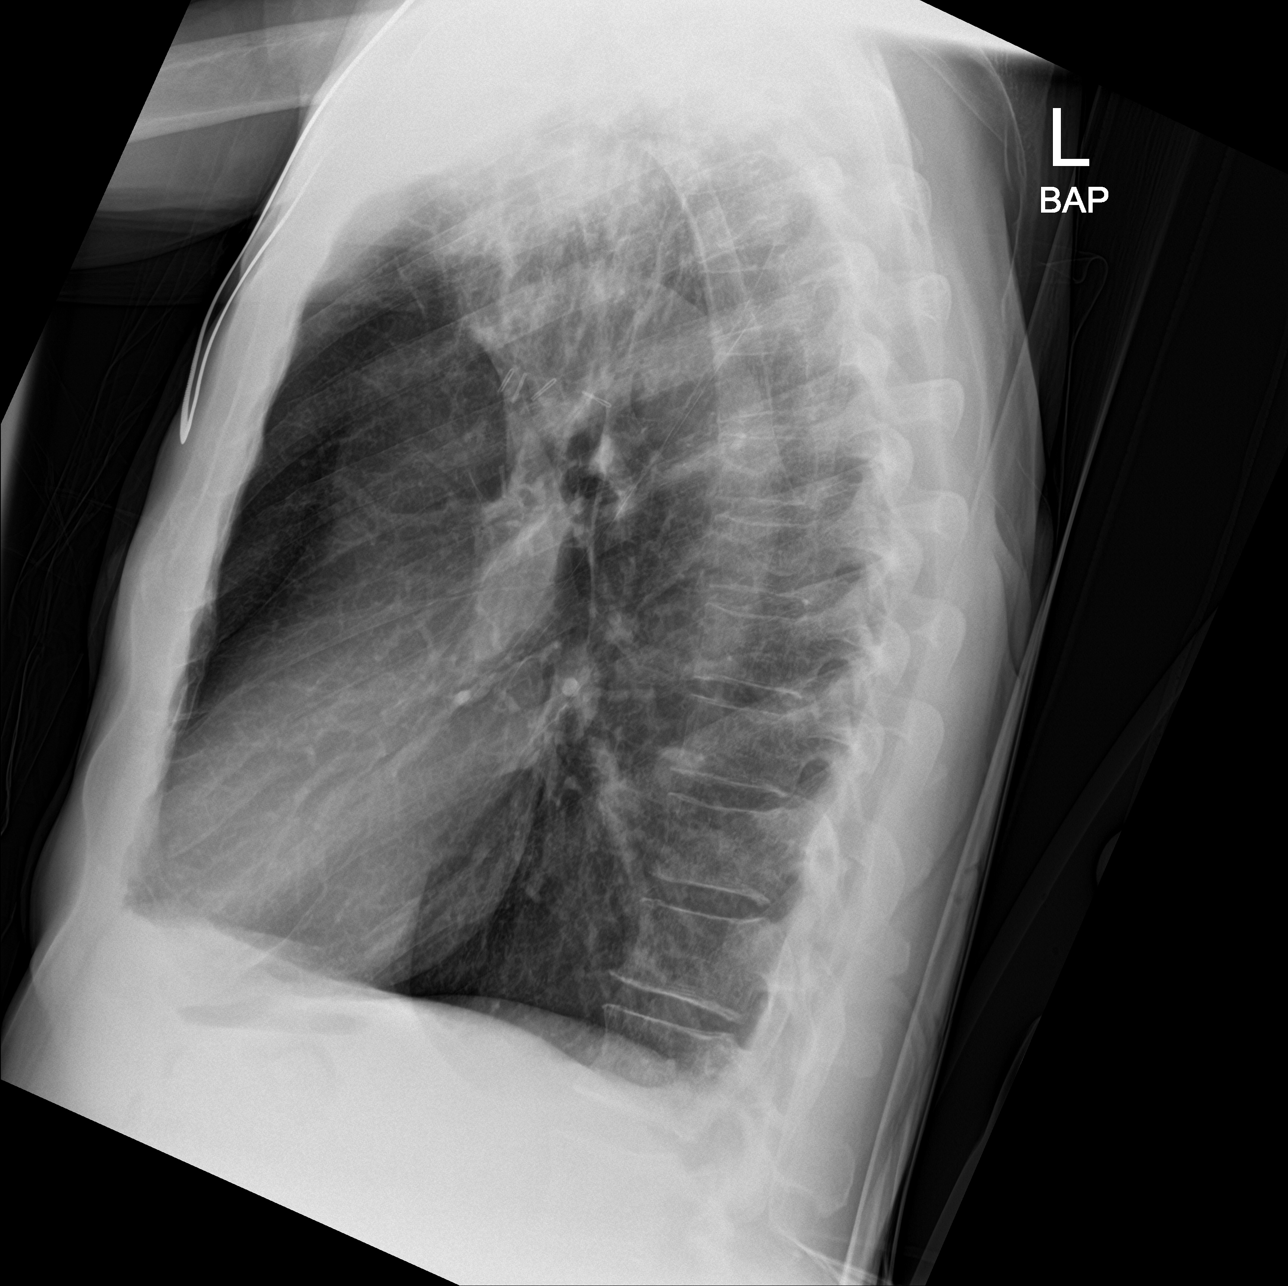

[chest ap (2 of 2)]
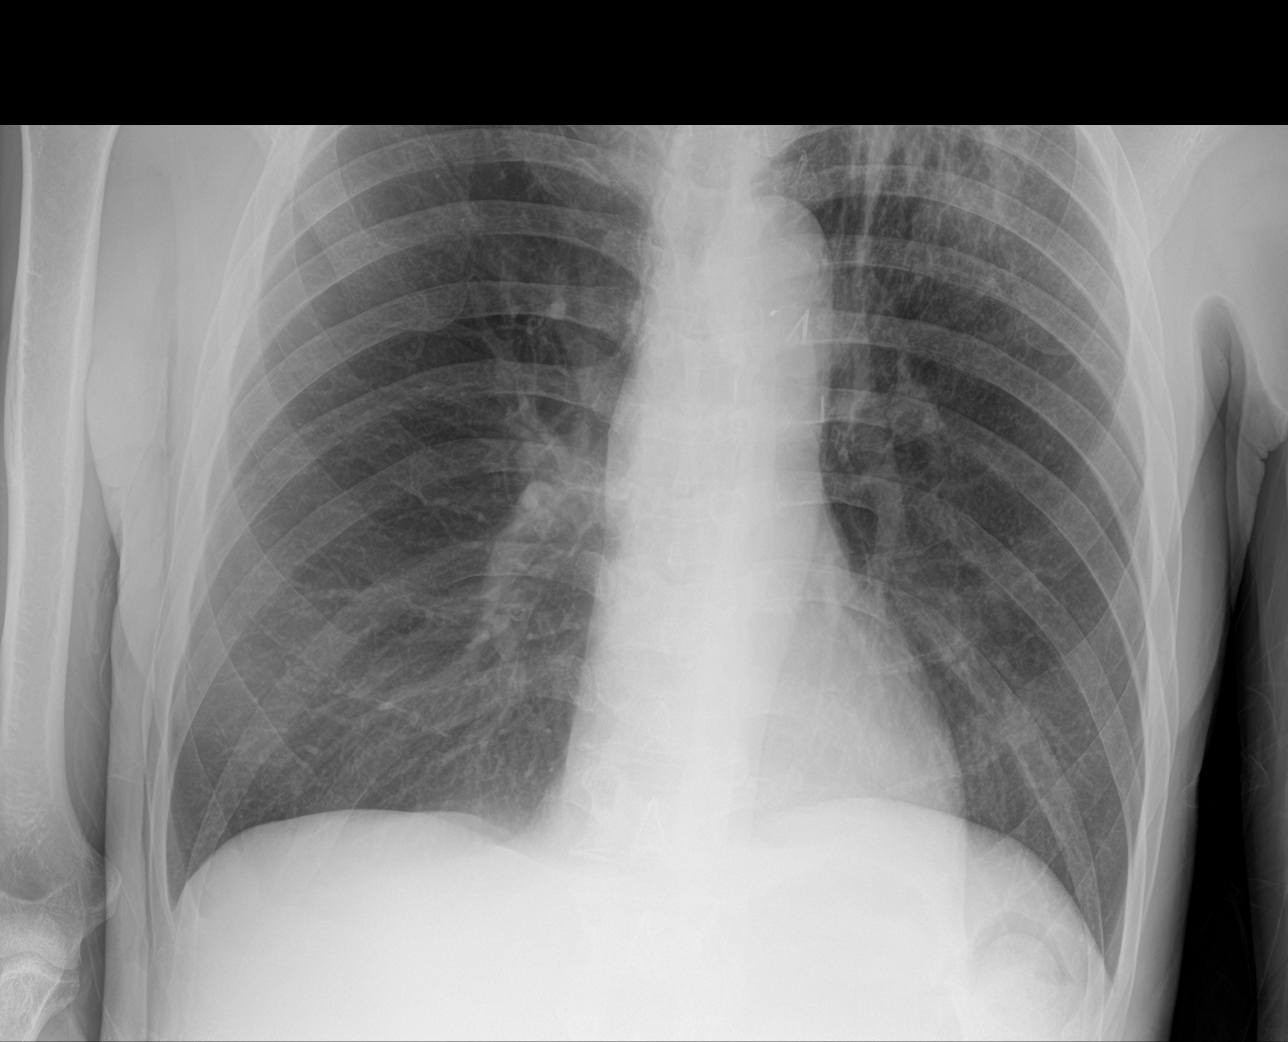

[3 of 3 positions shown; findings below may reference images not displayed]

FINDINGS: There is enlargement a left apical soft tissue mass measuring 5.4 x
2.1 cm. There is also parenchymal opacity within the left upper
lobe. These findings are worrisome for recurrence of lung carcinoma
and adjacent pneumonitis. Some these changes could be due to
radiation treatment and fibrosis. The lungs are otherwise clear and
somewhat hyperaerated. No pleural effusion is seen. Mediastinal and
hilar structures are unremarkable and left hilar surgical clips
remain. Heart size is normal. No bony abnormality is seen.
Unfortunately a metallic artifact overlies the left lung apex
obscuring some detail.
IMPRESSION: 1. Enlarging left apical soft tissue mass with surrounding
parenchymal opacity worrisome for recurrence of lung carcinoma and
adjacent pneumonitis.
2. The lungs are hyperaerated consistent with an element of
emphysema.

## 2019-05-27 ENCOUNTER — Ambulatory Visit (INDEPENDENT_AMBULATORY_CARE_PROVIDER_SITE_OTHER): Payer: Medicare HMO | Admitting: Family Medicine

## 2019-05-27 ENCOUNTER — Encounter: Payer: Self-pay | Admitting: Family Medicine

## 2019-05-27 DIAGNOSIS — A310 Pulmonary mycobacterial infection: Secondary | ICD-10-CM

## 2019-05-27 DIAGNOSIS — N529 Male erectile dysfunction, unspecified: Secondary | ICD-10-CM

## 2019-05-27 DIAGNOSIS — I10 Essential (primary) hypertension: Secondary | ICD-10-CM | POA: Diagnosis not present

## 2019-05-27 MED ORDER — ALBUTEROL SULFATE (2.5 MG/3ML) 0.083% IN NEBU: 2.5000 mg | INHALATION_SOLUTION | Freq: Four times a day (QID) | RESPIRATORY_TRACT | 1 refills | Status: DC | PRN

## 2019-05-27 MED ORDER — SILDENAFIL CITRATE 100 MG PO TABS: 50.0000 mg | ORAL_TABLET | Freq: Every day | ORAL | 11 refills | Status: DC | PRN

## 2019-05-27 MED ORDER — ALBUTEROL SULFATE HFA 108 (90 BASE) MCG/ACT IN AERS: 2.0000 | INHALATION_SPRAY | Freq: Four times a day (QID) | RESPIRATORY_TRACT | 11 refills | Status: DC | PRN

## 2019-05-27 NOTE — Progress Notes (Signed)
Subjective:    Patient ID: Eric Leach, male    DOB: June 02, 1950, 69 y.o.   MRN: [id number]   HPI: ODYSSEUS CADA II is a 69 y.o. male presenting for  presents for  follow-up of hypertension. Patient has no history of headache chest pain or shortness of breath or recent cough. Patient also denies symptoms of TIA such as focal numbness or weakness. Patient denies side effects from medication. States taking it regularly. He also has COPD related to his mycobacterium avium infection. He has some intermittent DOE, but able to do his routine activities.  Using  1/2 sildenafil with adequate response for E.D. denies adverse effects,     Depression screen [facility] 2/9 09/19/2018  Decreased Interest 0  Down, Depressed, Hopeless 0  PHQ - 2 Score 0     Relevant past medical, surgical, family and social history reviewed and updated as indicated.  Interim medical history since our last visit reviewed. Allergies and medications reviewed and updated.  ROS:  Review of Systems  Constitutional: Negative for fever.  Respiratory: Negative for shortness of breath.   Cardiovascular: Negative for chest pain.  Musculoskeletal: Negative for arthralgias.  Skin: Negative for rash.     Social History   Tobacco Use  Smoking Status Current Every Day Smoker  . Types: Cigarettes  Smokeless Tobacco Never Used       Objective:     Wt Readings from Last 3 Encounters:  10/29/18 147 lb (66.7 kg)  09/26/18 153 lb 9 oz (69.7 kg)  09/19/18 155 lb 3.2 oz (70.4 kg)     Exam deferred. Pt. Harboring due to COVID 19. Phone visit performed.   Assessment & Plan:   1. Essential hypertension   2. Erectile dysfunction, unspecified erectile dysfunction type   3. Mycobacterium avium complex ([place])     Meds ordered this encounter  Medications  . albuterol (VENTOLIN HFA) 108 (90 Base) MCG/ACT inhaler    Sig: Inhale 2 puffs into the lungs every 6 (six) hours as needed for wheezing or shortness of  breath.    Dispense:  18 g    Refill:  11  . albuterol (PROVENTIL) (2.5 MG/3ML) 0.083% nebulizer solution    Sig: Take 3 mLs (2.5 mg total) by nebulization every 6 (six) hours as needed for wheezing or shortness of breath.    Dispense:  150 mL    Refill:  1  . sildenafil (VIAGRA) 100 MG tablet    Sig: Take 0.5-1 tablets (50-100 mg total) by mouth daily as needed for erectile dysfunction (sex).    Dispense:  8 tablet    Refill:  11    3 chronic conditions addressed today are all stable.  Patient just needed his medications renewed.  Checkup by phone consistent with patient apparently doing well.  He should continue his medications and treatments as is.  Monitor for change in his shortness of breath condition particularly should it take a turn for the worse.    Diagnoses and all orders for this visit:  Essential hypertension  Erectile dysfunction, unspecified erectile dysfunction type -     sildenafil (VIAGRA) 100 MG tablet; Take 0.5-1 tablets (50-100 mg total) by mouth daily as needed for erectile dysfunction (sex).  Mycobacterium avium complex ([place]) -     albuterol (VENTOLIN HFA) 108 (90 Base) MCG/ACT inhaler; Inhale 2 puffs into the lungs every 6 (six) hours as needed for wheezing or shortness of breath. -     albuterol (PROVENTIL) (  2.5 MG/3ML) 0.083% nebulizer solution; Take 3 mLs (2.5 mg total) by nebulization every 6 (six) hours as needed for wheezing or shortness of breath.    Virtual Visit via telephone Note  I discussed the limitations, risks, security and privacy concerns of performing an evaluation and management service by telephone and the availability of in person appointments. The patient was identified with two identifiers. Pt.expressed understanding and agreed to proceed. Pt. Is at home. Dr. Livia Snellen is in his office.  Follow Up Instructions:   I discussed the assessment and treatment plan with the patient. The patient was provided an opportunity to ask questions and  all were answered. The patient agreed with the plan and demonstrated an understanding of the instructions.   The patient was advised to call back or seek an in-person evaluation if the symptoms worsen or if the condition fails to improve as anticipated.   Total minutes including chart review and phone contact time: 26   Follow up plan: Return in about 6 months (around 11/24/2019).  [name], MD [place]

## 2019-07-29 ENCOUNTER — Encounter: Payer: Self-pay | Admitting: Family Medicine

## 2019-07-29 ENCOUNTER — Ambulatory Visit (INDEPENDENT_AMBULATORY_CARE_PROVIDER_SITE_OTHER): Payer: Medicare HMO | Admitting: Family Medicine

## 2019-07-29 DIAGNOSIS — N529 Male erectile dysfunction, unspecified: Secondary | ICD-10-CM | POA: Diagnosis not present

## 2019-07-29 MED ORDER — TADALAFIL 20 MG PO TABS: 20.0000 mg | ORAL_TABLET | Freq: Every day | ORAL | 5 refills | Status: DC | PRN

## 2019-07-29 NOTE — Progress Notes (Signed)
    Subjective:    Patient ID: Eric Leach, male    DOB: October 14, 1950, 69 y.o.   MRN: [id number]   HPI: Eric Leach is a 69 y.o. male presenting for viagra  not working. Taking 100 mg. Sometimes works a little. Somwtimes works 3 hours later. Would like to try cialis instead    Depression screen [facility] 2/9 09/19/2018  Decreased Interest 0  Down, Depressed, Hopeless 0  PHQ - 2 Score 0     Relevant past medical, surgical, family and social history reviewed and updated as indicated.  Interim medical history since our last visit reviewed. Allergies and medications reviewed and updated.  ROS:  Review of Systems  Constitutional: Negative for fever.  Respiratory: Negative for shortness of breath.   Cardiovascular: Negative for chest pain.  Musculoskeletal: Negative for arthralgias.  Skin: Negative for rash.     Social History   Tobacco Use  Smoking Status Current Every Day Smoker  . Types: Cigarettes  Smokeless Tobacco Never Used       Objective:     Wt Readings from Last 3 Encounters:  10/29/18 147 lb (66.7 kg)  09/26/18 153 lb 9 oz (69.7 kg)  09/19/18 155 lb 3.2 oz (70.4 kg)     Exam deferred. Pt. Harboring due to COVID 19. Phone visit performed.   Assessment & Plan:   1. Erectile dysfunction, unspecified erectile dysfunction type     Meds ordered this encounter  Medications  . tadalafil (CIALIS) 20 MG tablet    Sig: Take 1 tablet (20 mg total) by mouth daily as needed for erectile dysfunction.    Dispense:  10 tablet    Refill:  5    No orders of the defined types were placed in this encounter.     Diagnoses and all orders for this visit:  Erectile dysfunction, unspecified erectile dysfunction type  Other orders -     tadalafil (CIALIS) 20 MG tablet; Take 1 tablet (20 mg total) by mouth daily as needed for erectile dysfunction.    Virtual Visit via telephone Note  I discussed the limitations, risks, security and privacy concerns of  performing an evaluation and management service by telephone and the availability of in person appointments. The patient was identified with two identifiers. Pt.expressed understanding and agreed to proceed. Pt. Is at home. Dr. Livia Snellen is in his office.  Follow Up Instructions:   I discussed the assessment and treatment plan with the patient. The patient was provided an opportunity to ask questions and all were answered. The patient agreed with the plan and demonstrated an understanding of the instructions.   The patient was advised to call back or seek an in-person evaluation if the symptoms worsen or if the condition fails to improve as anticipated.   Total minutes including chart review and phone contact time: 8   Follow up plan: No follow-ups on file.  [name], MD [place]

## 2019-08-31 ENCOUNTER — Other Ambulatory Visit: Payer: Self-pay | Admitting: Family Medicine

## 2019-08-31 DIAGNOSIS — I10 Essential (primary) hypertension: Secondary | ICD-10-CM

## 2019-09-24 ENCOUNTER — Other Ambulatory Visit: Payer: Self-pay | Admitting: Family Medicine

## 2019-09-26 ENCOUNTER — Other Ambulatory Visit: Payer: Self-pay | Admitting: Family Medicine

## 2019-09-26 ENCOUNTER — Encounter: Payer: Self-pay | Admitting: *Deleted

## 2019-10-04 ENCOUNTER — Other Ambulatory Visit: Payer: Self-pay | Admitting: Family Medicine

## 2019-11-21 ENCOUNTER — Ambulatory Visit: Payer: Medicare HMO | Admitting: Family Medicine

## 2019-11-25 ENCOUNTER — Ambulatory Visit: Payer: Medicare HMO | Admitting: Family Medicine

## 2020-06-08 IMAGING — CT CT CHEST WITH CONTRAST
2 of 3 series · 15 of 36 positions shown, 18 images · IV contrast (omnipaque)
Comparison: 10/05/2017 chest CT.  08/23/2018 chest CT angiogram.

CLINICAL DATA: Remote history of resected squamous cell left lung
cancer treated with chemotherapy and radiation therapy. Restaging.

EXAM:
CT CHEST WITH CONTRAST
TECHNIQUE: Multidetector CT imaging of the chest was performed during
intravenous contrast administration.
CONTRAST:  75mL OMNIPAQUE IOHEXOL 300 MG/ML  SOLN

[Series 2: axial st · axial · 0.72mm/px · z∈[+1254,+1582]mm · 12 of 194 slices shown, 15 images]
[im 15/194  mediastinal]
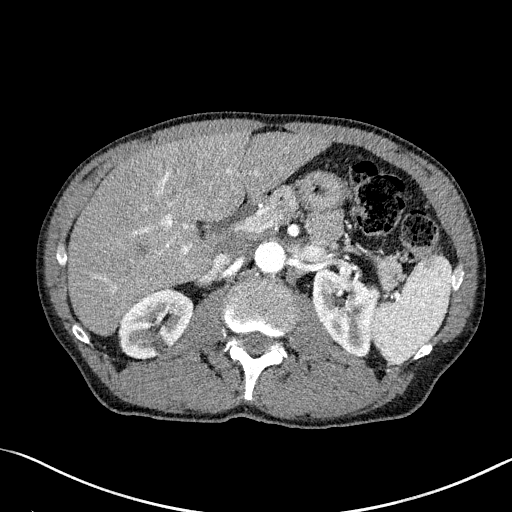
[im 15/194  lung]
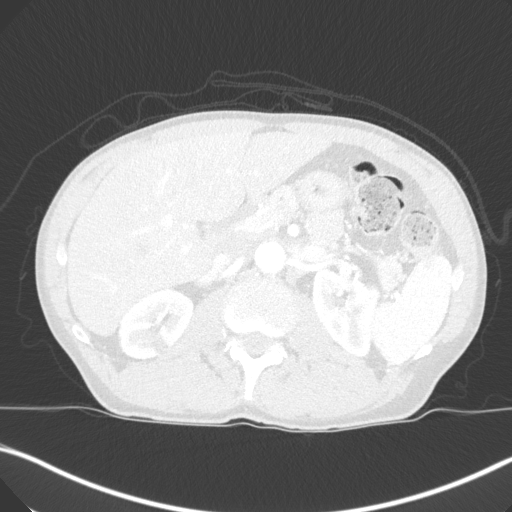
[im 29/194  lung]
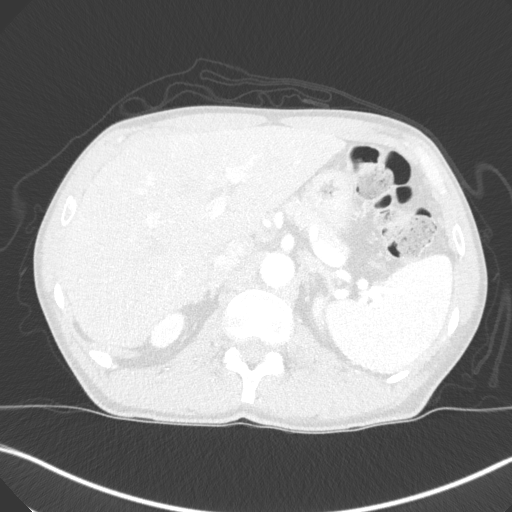
[im 43/194  lung]
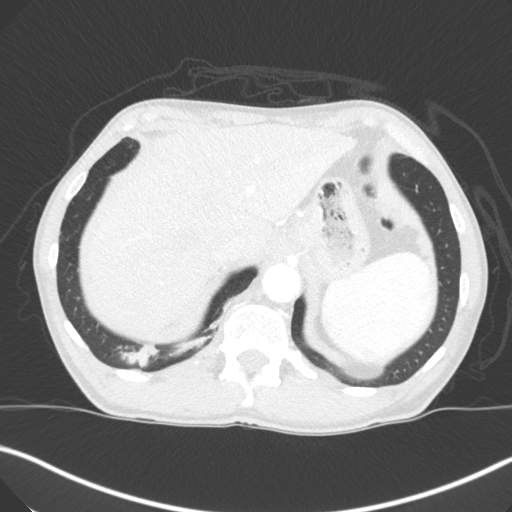
[im 58/194  lung]
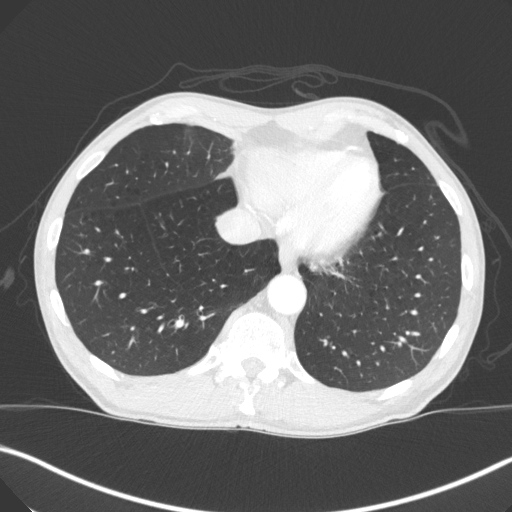
[im 72/194  mediastinal]
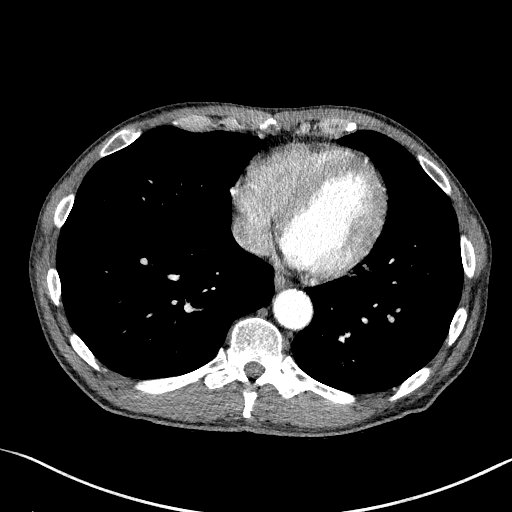
[im 72/194  lung]
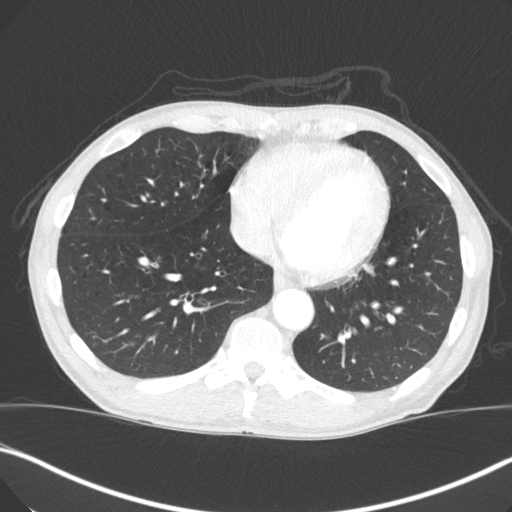
[im 86/194  lung]
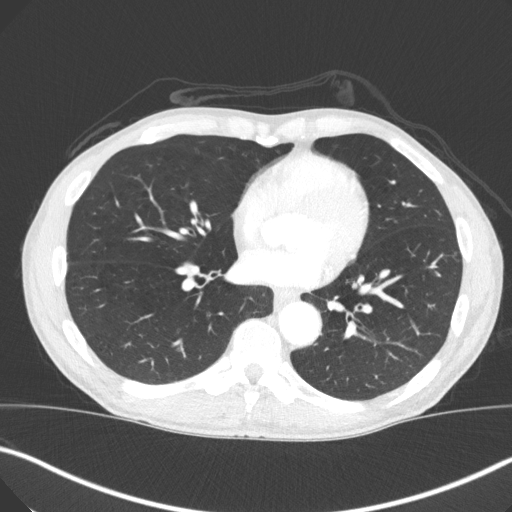
[im 108/194  lung]
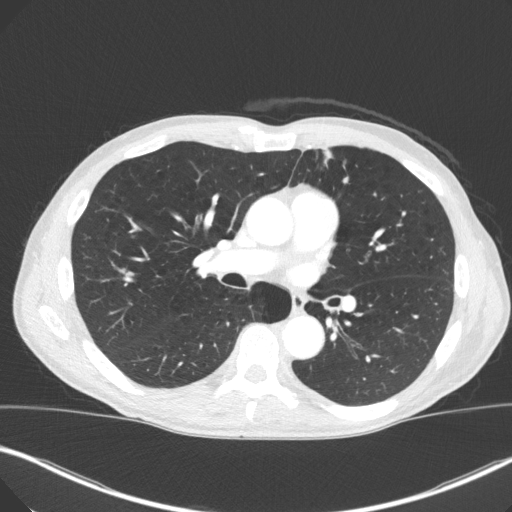
[im 122/194  lung]
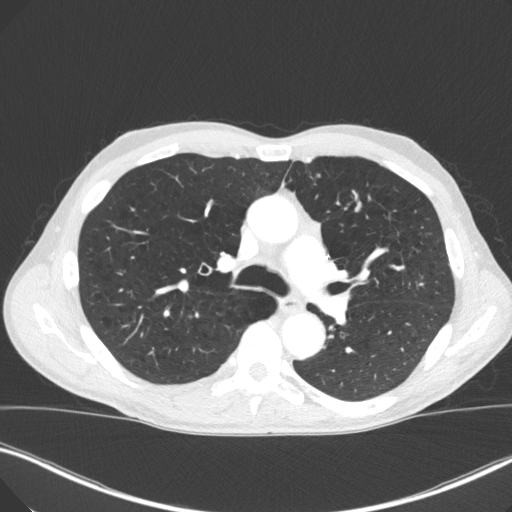
[im 136/194  mediastinal]
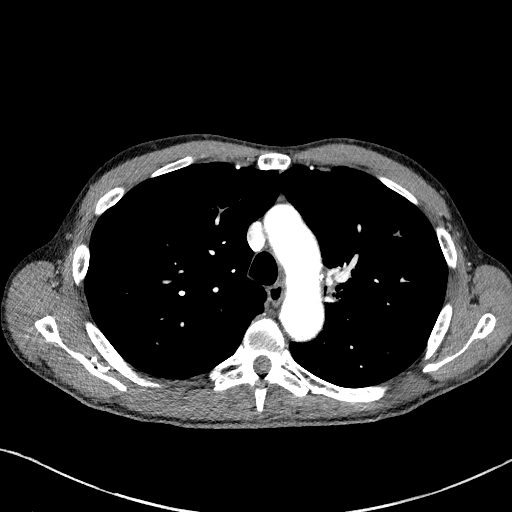
[im 136/194  lung]
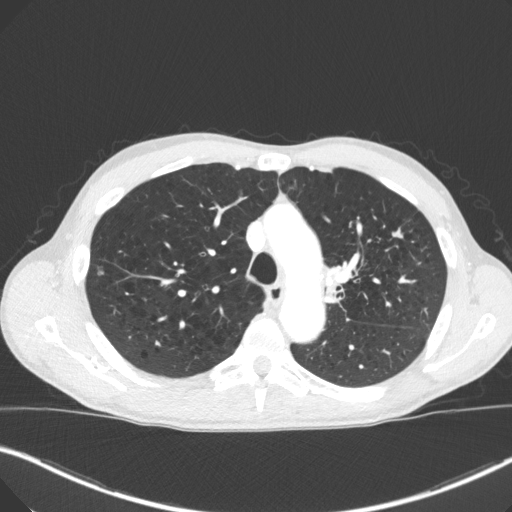
[im 151/194  lung]
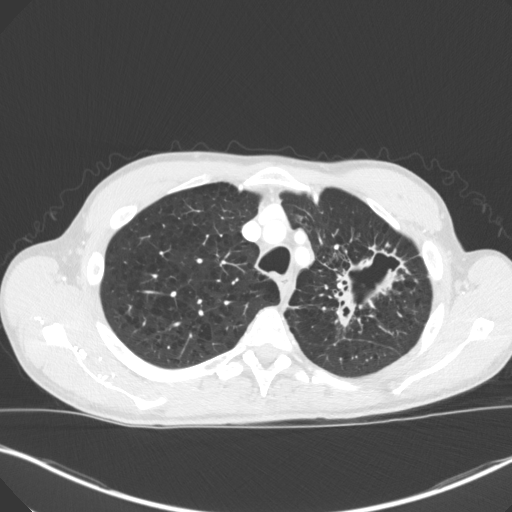
[im 165/194  lung]
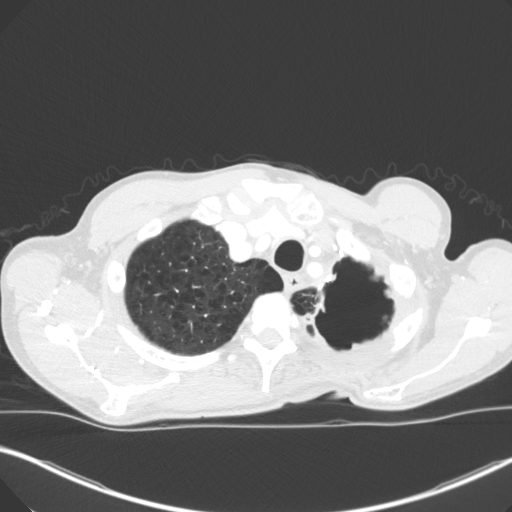
[im 179/194  lung]
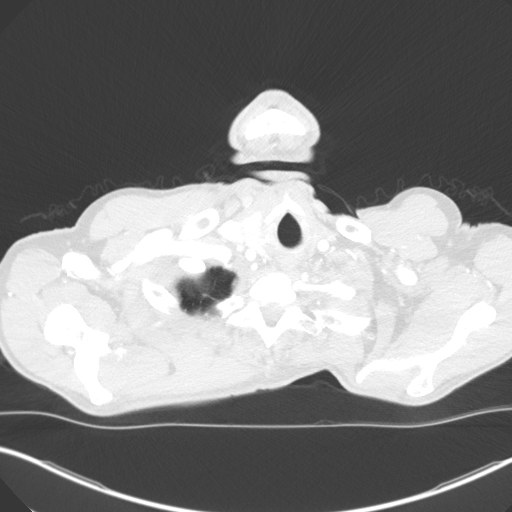

[Series 5: coronal · coronal · 0.77mm/px · 3 of 134 slices shown]
[im 27/134  lung]
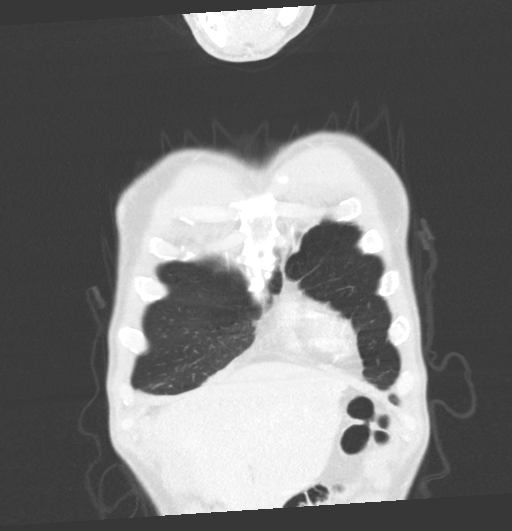
[im 54/134  lung]
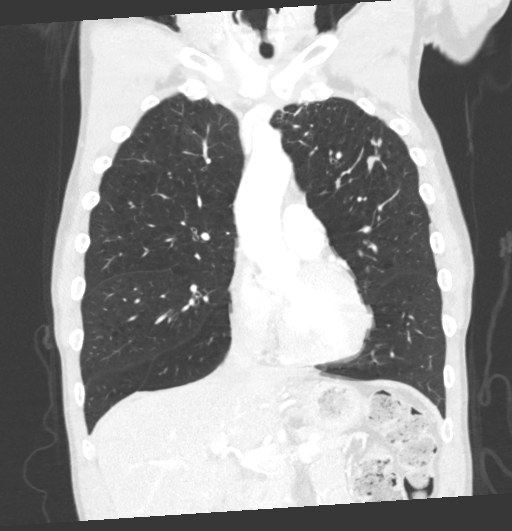
[im 80/134  lung]
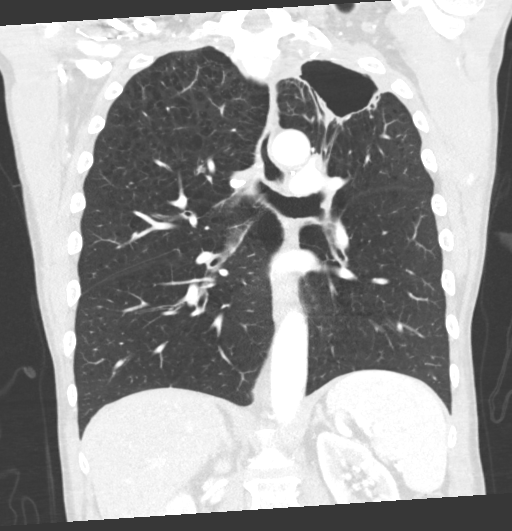

[15 of 36 positions shown; findings below may reference images not displayed]

FINDINGS: Cardiovascular: Normal heart size. No significant pericardial
effusion/thickening. Atherosclerotic nonaneurysmal thoracic aorta.
Normal caliber pulmonary arteries. No central pulmonary emboli.

Mediastinum/Nodes: Stable hypodense 2.0 cm right thyroid nodule.
Unremarkable esophagus. No pathologically enlarged axillary,
mediastinal or hilar lymph nodes.

Lungs/Pleura: No pneumothorax. No pleural effusion. Severe
centrilobular and paraseptal emphysema with diffuse bronchial wall
thickening. New irregular 2.9 x 1.7 cm nodular peribronchovascular
focus of consolidation in the basilar right lower lobe (series
4/image 152). Peripheral subpleural right upper lobe 1.1 cm
partially cavitary nodule (series 4/image 55), previously 1.1 cm,
stable since 08/23/2018 chest CT, new since 10/05/2017 chest CT.
There is a thick walled 8.1 x 5.7 cm cavitary lesion in the apical
left upper lobe abutting a suture line (series 4/image 33),
previously 8.2 x 6.1 cm, not appreciably changed in size or
appearance, including a stable dominant 1.3 cm nodular focus along
the medial wall of this cavitary lesion (series 4/image 33). Stable
scattered branching foci of mucoid impaction and mild tree-in-bud
opacity in the anterior left upper lobe. No additional significant
pulmonary nodules.

Upper abdomen: Subcentimeter hypodense posterior right liver lesion
is too small to characterize and is unchanged. Subcentimeter
hypodense posterior upper right renal cortical lesion is too small
to characterize and is unchanged.

Musculoskeletal: No aggressive appearing focal osseous lesions.
Stable asymmetric patchy sclerosis in the left first through third
ribs.
IMPRESSION: 1. New irregular 2.9 cm nodular peribronchovascular focus of
consolidation in the basilar right lower lobe, more likely due to
bronchopneumonia given the rapid appearance since 08/23/2018 chest
CT. Recommend attention on follow-up chest CT in 3 months.
2. Thick walled 8.1 cm cavitary lesion in the apical left upper lobe
abutting the suture line is stable. While post treatment change is
favored, continued close chest CT surveillance is recommended.
3. Partially cavitary 1.1 cm peripheral right upper lobe nodule is
stable since 08/23/2018 chest CT, although new since 10/05/2017
chest CT, also warranting continued close CT surveillance in 3
months.
4. No thoracic adenopathy.

Aortic Atherosclerosis (7LFUM-7T8.8) and Emphysema (7LFUM-2AX.H).

## 2020-08-08 ENCOUNTER — Inpatient Hospital Stay (HOSPITAL_COMMUNITY): Payer: Medicare Other

## 2020-08-08 ENCOUNTER — Inpatient Hospital Stay (HOSPITAL_COMMUNITY)
Admission: AD | Admit: 2020-08-08 | Discharge: 2020-08-13 | DRG: 190 | Disposition: A | Discharge status: Home / Self Care | Payer: Medicare Other | Source: Other Acute Inpatient Hospital | Attending: Internal Medicine | Admitting: Internal Medicine

## 2020-08-08 DIAGNOSIS — J9602 Acute respiratory failure with hypercapnia: Secondary | ICD-10-CM | POA: Diagnosis present

## 2020-08-08 DIAGNOSIS — Z833 Family history of diabetes mellitus: Secondary | ICD-10-CM

## 2020-08-08 DIAGNOSIS — F1190 Opioid use, unspecified, uncomplicated: Secondary | ICD-10-CM | POA: Diagnosis present

## 2020-08-08 DIAGNOSIS — Z8249 Family history of ischemic heart disease and other diseases of the circulatory system: Secondary | ICD-10-CM

## 2020-08-08 DIAGNOSIS — F1910 Other psychoactive substance abuse, uncomplicated: Secondary | ICD-10-CM | POA: Diagnosis present

## 2020-08-08 DIAGNOSIS — Z85118 Personal history of other malignant neoplasm of bronchus and lung: Secondary | ICD-10-CM | POA: Diagnosis not present

## 2020-08-08 DIAGNOSIS — G9341 Metabolic encephalopathy: Secondary | ICD-10-CM | POA: Diagnosis present

## 2020-08-08 DIAGNOSIS — N529 Male erectile dysfunction, unspecified: Secondary | ICD-10-CM | POA: Diagnosis present

## 2020-08-08 DIAGNOSIS — I10 Essential (primary) hypertension: Secondary | ICD-10-CM | POA: Diagnosis present

## 2020-08-08 DIAGNOSIS — Z8701 Personal history of pneumonia (recurrent): Secondary | ICD-10-CM | POA: Diagnosis not present

## 2020-08-08 DIAGNOSIS — E119 Type 2 diabetes mellitus without complications: Secondary | ICD-10-CM

## 2020-08-08 DIAGNOSIS — E1165 Type 2 diabetes mellitus with hyperglycemia: Secondary | ICD-10-CM | POA: Diagnosis present

## 2020-08-08 DIAGNOSIS — B9789 Other viral agents as the cause of diseases classified elsewhere: Secondary | ICD-10-CM | POA: Diagnosis present

## 2020-08-08 DIAGNOSIS — F17210 Nicotine dependence, cigarettes, uncomplicated: Secondary | ICD-10-CM | POA: Diagnosis present

## 2020-08-08 DIAGNOSIS — Z902 Acquired absence of lung [part of]: Secondary | ICD-10-CM

## 2020-08-08 DIAGNOSIS — J441 Chronic obstructive pulmonary disease with (acute) exacerbation: Secondary | ICD-10-CM | POA: Diagnosis present

## 2020-08-08 DIAGNOSIS — J9601 Acute respiratory failure with hypoxia: Secondary | ICD-10-CM

## 2020-08-08 DIAGNOSIS — Z79899 Other long term (current) drug therapy: Secondary | ICD-10-CM | POA: Diagnosis not present

## 2020-08-08 DIAGNOSIS — I160 Hypertensive urgency: Secondary | ICD-10-CM | POA: Diagnosis present

## 2020-08-08 DIAGNOSIS — R451 Restlessness and agitation: Secondary | ICD-10-CM | POA: Diagnosis not present

## 2020-08-08 DIAGNOSIS — J440 Chronic obstructive pulmonary disease with acute lower respiratory infection: Secondary | ICD-10-CM | POA: Diagnosis present

## 2020-08-08 DIAGNOSIS — E876 Hypokalemia: Secondary | ICD-10-CM | POA: Diagnosis present

## 2020-08-08 DIAGNOSIS — Z882 Allergy status to sulfonamides status: Secondary | ICD-10-CM | POA: Diagnosis not present

## 2020-08-08 DIAGNOSIS — F1911 Other psychoactive substance abuse, in remission: Secondary | ICD-10-CM

## 2020-08-08 DIAGNOSIS — Z20822 Contact with and (suspected) exposure to covid-19: Secondary | ICD-10-CM | POA: Diagnosis present

## 2020-08-08 DIAGNOSIS — R739 Hyperglycemia, unspecified: Secondary | ICD-10-CM

## 2020-08-08 DIAGNOSIS — D72829 Elevated white blood cell count, unspecified: Secondary | ICD-10-CM

## 2020-08-08 HISTORY — DX: Acute respiratory failure with hypoxia: J96.01

## 2020-08-08 LAB — COMPREHENSIVE METABOLIC PANEL
ALT: 24 U/L (ref 0–44)
AST: 20 U/L (ref 15–41)
Albumin: 3.2 g/dL — ABNORMAL LOW (ref 3.5–5.0)
Alkaline Phosphatase: 77 U/L (ref 38–126)
Anion gap: 9 (ref 5–15)
BUN: 24 mg/dL — ABNORMAL HIGH (ref 8–23)
CO2: 34 mmol/L — ABNORMAL HIGH (ref 22–32)
Calcium: 9.5 mg/dL (ref 8.9–10.3)
Chloride: 97 mmol/L — ABNORMAL LOW (ref 98–111)
Creatinine, Ser: 0.9 mg/dL (ref 0.61–1.24)
GFR, Estimated: >60 mL/min (ref >60)
Glucose, Bld: 153 mg/dL — ABNORMAL HIGH (ref 70–99)
Potassium: 4.1 mmol/L (ref 3.5–5.1)
Sodium: 140 mmol/L (ref 135–145)
Total Bilirubin: 0.8 mg/dL (ref 0.3–1.2)
Total Protein: 7.2 g/dL (ref 6.5–8.1)

## 2020-08-08 LAB — BLOOD GAS, ARTERIAL
Acid-Base Excess: 11 mmol/L — ABNORMAL HIGH (ref 0.0–2.0)
Acid-Base Excess: 9.8 mmol/L — ABNORMAL HIGH (ref 0.0–2.0)
Bicarbonate: 36.6 mmol/L — ABNORMAL HIGH (ref 20.0–28.0)
Bicarbonate: 35.9 mmol/L — ABNORMAL HIGH (ref 20.0–28.0)
FIO2: 30
FIO2: 50
O2 Saturation: 92.3 %
O2 Saturation: 95.5 %
Patient temperature: 98.6
Patient temperature: 98.6
RATE: 14 resp/min
pCO2 arterial: 51.5 mmHg — ABNORMAL HIGH (ref 32.0–48.0)
pCO2 arterial: 54.2 mmHg — ABNORMAL HIGH (ref 32.0–48.0)
pH, Arterial: 7.466 — ABNORMAL HIGH (ref 7.350–7.450)
pH, Arterial: 7.436 (ref 7.350–7.450)
pO2, Arterial: 63.8 mmHg — ABNORMAL LOW (ref 83.0–108.0)
pO2, Arterial: 79.2 mmHg — ABNORMAL LOW (ref 83.0–108.0)

## 2020-08-08 LAB — GLUCOSE, CAPILLARY: Glucose-Capillary: 149 mg/dL — ABNORMAL HIGH (ref 70–99)

## 2020-08-08 LAB — CBC WITH DIFFERENTIAL/PLATELET
Abs Immature Granulocytes: 0.14 10*3/uL — ABNORMAL HIGH (ref 0.00–0.07)
Basophils Absolute: 0 10*3/uL (ref 0.0–0.1)
Basophils Relative: 0 %
Eosinophils Absolute: 0 10*3/uL (ref 0.0–0.5)
Eosinophils Relative: 0 %
HCT: 48.7 % (ref 39.0–52.0)
Hemoglobin: 15.8 g/dL (ref 13.0–17.0)
Immature Granulocytes: 1 %
Lymphocytes Relative: 7 %
Lymphs Abs: 1.2 10*3/uL (ref 0.7–4.0)
MCH: 28.3 pg (ref 26.0–34.0)
MCHC: 32.4 g/dL (ref 30.0–36.0)
MCV: 87.3 fL (ref 80.0–100.0)
Monocytes Absolute: 1.3 10*3/uL — ABNORMAL HIGH (ref 0.1–1.0)
Monocytes Relative: 8 %
Neutro Abs: 13.8 10*3/uL — ABNORMAL HIGH (ref 1.7–7.7)
Neutrophils Relative %: 84 %
Platelets: 303 10*3/uL (ref 150–400)
RBC: 5.58 MIL/uL (ref 4.22–5.81)
RDW: 13.6 % (ref 11.5–15.5)
WBC: 16.5 10*3/uL — ABNORMAL HIGH (ref 4.0–10.5)
nRBC: 0 % (ref 0.0–0.2)

## 2020-08-08 LAB — PHOSPHORUS: Phosphorus: 3.2 mg/dL (ref 2.5–4.6)

## 2020-08-08 LAB — MRSA PCR SCREENING: MRSA by PCR: NEGATIVE

## 2020-08-08 LAB — HIV ANTIBODY (ROUTINE TESTING W REFLEX): HIV Screen 4th Generation wRfx: NONREACTIVE

## 2020-08-08 LAB — MAGNESIUM: Magnesium: 2.2 mg/dL (ref 1.7–2.4)

## 2020-08-08 MED ORDER — ONDANSETRON HCL 4 MG/2ML IJ SOLN: 4.0000 mg | Freq: Four times a day (QID) | INTRAMUSCULAR | Status: DC | PRN

## 2020-08-08 MED ORDER — HALOPERIDOL LACTATE 5 MG/ML IJ SOLN: 1.0000 mg | INTRAMUSCULAR | Status: DC | PRN

## 2020-08-08 MED ORDER — DOCUSATE SODIUM 100 MG PO CAPS: 100.0000 mg | ORAL_CAPSULE | Freq: Two times a day (BID) | ORAL | Status: DC | PRN

## 2020-08-08 MED ORDER — METHYLPREDNISOLONE SODIUM SUCC 40 MG IJ SOLR
40.0000 mg | Freq: Two times a day (BID) | INTRAMUSCULAR | Status: DC
  Administered 2020-08-08 – 2020-08-09 (×3): 40 mg via INTRAVENOUS
  Filled 2020-08-08 (×3): qty 1

## 2020-08-08 MED ORDER — CHLORHEXIDINE GLUCONATE 0.12% ORAL RINSE (MEDLINE KIT)
15.0000 mL | Freq: Two times a day (BID) | OROMUCOSAL | Status: DC
  Administered 2020-08-08 – 2020-08-09 (×3): 15 mL via OROMUCOSAL

## 2020-08-08 MED ORDER — BUDESONIDE 0.5 MG/2ML IN SUSP
0.5000 mg | Freq: Two times a day (BID) | RESPIRATORY_TRACT | Status: DC
  Administered 2020-08-08 – 2020-08-12 (×8): 0.5 mg via RESPIRATORY_TRACT
  Filled 2020-08-08 (×10): qty 2

## 2020-08-08 MED ORDER — ORAL CARE MOUTH RINSE
15.0000 mL | OROMUCOSAL | Status: DC
  Administered 2020-08-09 (×6): 15 mL via OROMUCOSAL

## 2020-08-08 MED ORDER — NICARDIPINE HCL IN NACL 20-0.86 MG/200ML-% IV SOLN
3.0000 mg/h | INTRAVENOUS | Status: DC
  Administered 2020-08-08 – 2020-08-09 (×2): 3 mg/h via INTRAVENOUS
  Filled 2020-08-08 (×4): qty 200

## 2020-08-08 MED ORDER — IPRATROPIUM-ALBUTEROL 0.5-2.5 (3) MG/3ML IN SOLN
3.0000 mL | Freq: Four times a day (QID) | RESPIRATORY_TRACT | Status: DC | PRN
  Administered 2020-08-10: 3 mL via RESPIRATORY_TRACT
  Filled 2020-08-08: qty 3

## 2020-08-08 MED ORDER — HEPARIN SODIUM (PORCINE) 5000 UNIT/ML IJ SOLN
5000.0000 [IU] | Freq: Three times a day (TID) | INTRAMUSCULAR | Status: DC
  Administered 2020-08-09: 5000 [IU] via SUBCUTANEOUS
  Filled 2020-08-08: qty 1

## 2020-08-08 MED ORDER — ARFORMOTEROL TARTRATE 15 MCG/2ML IN NEBU
15.0000 ug | INHALATION_SOLUTION | Freq: Two times a day (BID) | RESPIRATORY_TRACT | Status: DC
  Administered 2020-08-08 – 2020-08-13 (×9): 15 ug via RESPIRATORY_TRACT
  Filled 2020-08-08 (×10): qty 2

## 2020-08-08 MED ORDER — DEXMEDETOMIDINE HCL IN NACL 200 MCG/50ML IV SOLN
0.2000 ug/kg/h | INTRAVENOUS | Status: DC
  Administered 2020-08-08: 0.2 ug/kg/h via INTRAVENOUS
  Administered 2020-08-09 (×2): 0.5 ug/kg/h via INTRAVENOUS
  Administered 2020-08-09 – 2020-08-10 (×2): 0.4 ug/kg/h via INTRAVENOUS
  Administered 2020-08-10: 0.5 ug/kg/h via INTRAVENOUS
  Filled 2020-08-08 (×6): qty 50

## 2020-08-08 MED ORDER — POLYETHYLENE GLYCOL 3350 17 G PO PACK: 17.0000 g | PACK | Freq: Every day | ORAL | Status: DC | PRN

## 2020-08-08 MED ORDER — CHLORHEXIDINE GLUCONATE CLOTH 2 % EX PADS
6.0000 | MEDICATED_PAD | Freq: Every day | CUTANEOUS | Status: DC
  Administered 2020-08-08 – 2020-08-10 (×2): 6 via TOPICAL

## 2020-08-08 NOTE — Progress Notes (Addendum)
eLink Physician-Brief Progress Note Patient Name: Eric Leach DOB: 10/25/1950 MRN: [id number]   Date of Service  08/08/2020  HPI/Events of Note  I camera'd in and spoke with patient's son shortly after getting report from Dr. Lake Bells, son had not yet spoken with his physician family member, I emphasized the need to speak as soon as possible with the family member to clarify the intubation situation since elective intubations are inherently safer than emergent ones when the patient is actually crashing, he acknowledged understanding and indicated he would do so, bedside RN is to notify me after son speaks with family member.  eICU Interventions  See above note.        Frederik Pear 08/08/2020, 8:54 PM

## 2020-08-08 NOTE — H&P (Signed)
NAME:  Eric Leach, MRN:  [id number], DOB:  01-29-1951, LOS: 0 ADMISSION DATE:  08/08/2020, CONSULTATION DATE:  4/16 REFERRING MD:  Calvert Cantor, CHIEF COMPLAINT:  Dyspnea   History of Present Illness:  70 y/o male admitted to [facility] on 4/13 with dyspnea, diagnosed with a COPD exacerbation and treated for the same.  After several days he was moved to the ICU, placed on BIPAP, and given ativan. PCCM called for transfer request for further evaluation. The patient was unable to provide history due to confusion and somnolence.   We know he was put on BIPAP today but there is no clear reason what lead to this.  The patient was also given ativan several times.  [facility] physicians requested the patient be intubated prior to transfer but the [facility] physicians felt this was not necessary.   There were not physician's notes provided by [facility]. History was obtained from the patient's son who says he spoke to his father once during the hospital visit and said he sounded confused.  The son says he thinks his father uses drugs but he doesn't think he drinks.  He says he has been admitted several times for breathing difficulty this year, and uses a nebulizer at home.  He says his father still smokes cigarettes. The son doesn't know if his father is vaccinated against COVID. Chart review shows that the patient tested negative for COVID on 4/13 but he was positive for rhinovirus.  Pertinent  Medical History  Lung cancer s/p left upper lobectomy MAI, cavitary left upper lobe mass, treated with 3 drug therapy in 2022 COPD FEV1 44% in 2021 at [facility] Cigarette smoker History of IV drug use  Significant Hospital Events: Including procedures, antibiotic start and stop dates in addition to other pertinent events   . 4/13 admitted to [facility] . 4/16 transfer to [facility] for confusion, hypercapnea, need for BIPAP  Interim History / Subjective:  As above  Objective   SpO2 95 %.    Vent Mode: BIPAP FiO2 (%):  [30 %] 30  % Set Rate:  [14 bmp] 14 bmp PEEP:  [6 cmH20] 6 cmH20  No intake or output data in the 24 hours ending 08/08/20 1842 There were no vitals filed for this visit.  Examination:  General:  Tachypnea in bed, no accessory muscle use,  HENT: NCAT OP clear, NIMV mask in place PULM: Wheezing bilaterally, poor air movement, increased effort CV: tachy, regular, no mgr GI: BS+, soft, nontender MSK: normal bulk and tone Neuro: drowsy but will wake to voice and intermittently follow commands but then drifts off to sleep again   Labs/imaging that I havepersonally reviewed  (right click and "Reselect all SmartList Selections" daily)  Labs from [facility] reviewed, CBC showed WBC of 19K, Hgb OK, PLt OK Chemistry today showed normal electrolytes but slightly elevated glucose and CO2 of 33 ABG from this morning showed a compensated respiratory acidosis, repeat ABG at 1428 showed 7.52/46/87/37/97.5% (noted to be on BIPAP 14/7, 40% FiO2) 4/13 SARS COV2 negative, positive for rhinovirus  4/14 CT angiogram > negative for PE, notable for LUL cavity, prior surgery LUL, emphysema noted 4/14 CT head > atrophy, no acute changes   Resolved Hospital Problem list     Assessment & Plan:  Acute respiratory failure with hypercarbia due to a COPD exacerbation caused by rhinovirus Severe COPD with repeated exacerbations Admit to ICU NIMV for now Continue solumedrol Hold antibiotics Start brovana/pulmicort duoneb q6h prn Check ABG See discussion below regarding intubation/mechanical  ventilation  Acute metabolic encephalopathy> difficult picture to discern, high likelihood of withdrawal syndrome given long history of polysubstance abuse, but also at risk for ICU delirium made worse by steroids and hypercarbia; CT head at [facility] negative precedex infusion for now per ICU etoh withdrawal protocol for guidance of titration Avoid benzo use Haldol prn Check ABG Frequent orientation  MAI infection with cavitary left  upper lobe pneumonia Hold three drug therapy for now Monitor for sputum production and test for AFB if he produces mucus  History of IV drug use Monitor for withdrawal syndrome as noted above  Hyeprtensive urgency at [facility]? (again unclear as we have no records) Continue cardene drip here  Goals of care: I called his son to ask whether or not he would want his father intubated. He has severe COPD as noted by PFT and repeated hospitalization for COPD.  The son intends to visit this evening after he has discussed the scenario with a family member who is a physician.  For now remains full code, high risk for intubation, but the son wants to talk to his family before deciding about intubation vs DNR status.   Best practice (right click and "Reselect all SmartList Selections" daily)  Diet:  NPO Pain/Anxiety/Delirium protocol (if indicated): No VAP protocol (if indicated): Not indicated DVT prophylaxis: LMWH GI prophylaxis: H2B Glucose control:  SSI No Central venous access:  N/A Arterial line:  N/A Foley:  N/A Mobility:  bed rest  PT consulted: Yes Last date of multidisciplinary goals of care discussion [4/16] Code Status:  full code Disposition: admit to ICU  Labs   CBC: No results for input(s): WBC, NEUTROABS, HGB, HCT, MCV, PLT in the last 168 hours.  Basic Metabolic Panel: No results for input(s): NA, K, CL, CO2, GLUCOSE, BUN, CREATININE, CALCIUM, MG, PHOS in the last 168 hours. GFR: CrCl cannot be calculated (Patient's most recent lab result is older than the maximum 21 days allowed.). No results for input(s): PROCALCITON, WBC, LATICACIDVEN in the last 168 hours.  Liver Function Tests: No results for input(s): AST, ALT, ALKPHOS, BILITOT, PROT, ALBUMIN in the last 168 hours. No results for input(s): LIPASE, AMYLASE in the last 168 hours. No results for input(s): AMMONIA in the last 168 hours.  ABG No results found for: PHART, PCO2ART, PO2ART, HCO3, TCO2, ACIDBASEDEF, O2SAT    Coagulation Profile: No results for input(s): INR, PROTIME in the last 168 hours.  Cardiac Enzymes: No results for input(s): CKTOTAL, CKMB, CKMBINDEX, TROPONINI in the last 168 hours.  HbA1C: No results found for: HGBA1C  CBG: No results for input(s): GLUCAP in the last 168 hours.  Review of Systems:   Cannot obtain due to intubation  Past Medical History:  He,  has a past medical history of COPD (chronic obstructive pulmonary disease) ([place]), Hypertension, and Lung cancer ([place]).   Surgical History:   Past Surgical History:  Procedure Laterality Date  . KNEE ARTHROSCOPY    . LUNG SURGERY       Social History:   reports that he has been smoking cigarettes. He has never used smokeless tobacco. He reports current alcohol use. He reports that he does not use drugs.   Family History:  His family history includes Cancer in his father and mother; Diabetes in his father; Hypertension in his mother.   Allergies Allergies  Allergen Reactions  . Sulfa Antibiotics      Home Medications  Prior to Admission medications   Medication Sig Start Date End Date Taking? Authorizing  Provider  albuterol (PROVENTIL) (2.5 MG/3ML) 0.083% nebulizer solution Take 3 mLs (2.5 mg total) by nebulization every 6 (six) hours as needed for wheezing or shortness of breath. 05/27/19   [name], MD  albuterol (VENTOLIN HFA) 108 (90 Base) MCG/ACT inhaler Inhale 2 puffs into the lungs every 6 (six) hours as needed for wheezing or shortness of breath. 05/27/19   [name], MD  lisinopril-hydrochlorothiazide (ZESTORETIC) 10-12.5 MG tablet TAKE 1 TABLET BY MOUTH DAILY FOR BLOOD PRESSURE 09/02/19   [name], MD  Nebulizer MISC Use with albuterol QID. Dx: A31.0, J41.8 03/12/19   [name], MD  sildenafil (VIAGRA) 100 MG tablet Take 0.5-1 tablets (50-100 mg total) by mouth daily as needed for erectile dysfunction (sex). 05/27/19   [name], MD  tadalafil (CIALIS) 20 MG tablet Take 1 tablet  (20 mg total) by mouth daily as needed for erectile dysfunction. 07/29/19   [name], MD     Critical care time: 40 minutes    Roselie Awkward, MD [place] Pager: [phone] Cell: [phone] If no response, please call [phone] until 7pm After 7:00 pm call Elink  [phone]

## 2020-08-08 NOTE — Progress Notes (Addendum)
eLink Physician-Brief Progress Note Patient Name: Eric Leach DOB: [birth year] MRN: [id number]   Date of Service  08/08/2020  HPI/Events of Note  ABG reviewed and appears reasonable except for expected low PO2 given history  Of COPD, he does rouse with stimulation and interact a bit, Precedex was started for agitation.  eICU Interventions  Will increase FIO2 to 40 % and repeat gas in one hour.        Kerry Kass Dannon Perlow 08/08/2020, 9:08 PM

## 2020-08-09 DIAGNOSIS — J9601 Acute respiratory failure with hypoxia: Secondary | ICD-10-CM | POA: Diagnosis not present

## 2020-08-09 LAB — BASIC METABOLIC PANEL
Anion gap: 7 (ref 5–15)
BUN: 27 mg/dL — ABNORMAL HIGH (ref 8–23)
CO2: 33 mmol/L — ABNORMAL HIGH (ref 22–32)
Calcium: 8.8 mg/dL — ABNORMAL LOW (ref 8.9–10.3)
Chloride: 100 mmol/L (ref 98–111)
Creatinine, Ser: 0.8 mg/dL (ref 0.61–1.24)
GFR, Estimated: >60 mL/min (ref >60)
Glucose, Bld: 165 mg/dL — ABNORMAL HIGH (ref 70–99)
Potassium: 4.5 mmol/L (ref 3.5–5.1)
Sodium: 140 mmol/L (ref 135–145)

## 2020-08-09 LAB — GLUCOSE, CAPILLARY
Glucose-Capillary: 151 mg/dL — ABNORMAL HIGH (ref 70–99)
Glucose-Capillary: 157 mg/dL — ABNORMAL HIGH (ref 70–99)
Glucose-Capillary: 158 mg/dL — ABNORMAL HIGH (ref 70–99)
Glucose-Capillary: 188 mg/dL — ABNORMAL HIGH (ref 70–99)
Glucose-Capillary: 208 mg/dL — ABNORMAL HIGH (ref 70–99)
Glucose-Capillary: 214 mg/dL — ABNORMAL HIGH (ref 70–99)

## 2020-08-09 LAB — CBC
HCT: 43.4 % (ref 39.0–52.0)
Hemoglobin: 14.1 g/dL (ref 13.0–17.0)
MCH: 28.2 pg (ref 26.0–34.0)
MCHC: 32.5 g/dL (ref 30.0–36.0)
MCV: 86.8 fL (ref 80.0–100.0)
Platelets: 264 10*3/uL (ref 150–400)
RBC: 5 MIL/uL (ref 4.22–5.81)
RDW: 13.4 % (ref 11.5–15.5)
WBC: 12.6 10*3/uL — ABNORMAL HIGH (ref 4.0–10.5)
nRBC: 0 % (ref 0.0–0.2)

## 2020-08-09 LAB — MAGNESIUM: Magnesium: 2.1 mg/dL (ref 1.7–2.4)

## 2020-08-09 LAB — PHOSPHORUS: Phosphorus: 4.9 mg/dL — ABNORMAL HIGH (ref 2.5–4.6)

## 2020-08-09 MED ORDER — ORAL CARE MOUTH RINSE
15.0000 mL | Freq: Two times a day (BID) | OROMUCOSAL | Status: DC
  Administered 2020-08-09: 15 mL via OROMUCOSAL

## 2020-08-09 MED ORDER — ENOXAPARIN SODIUM 40 MG/0.4ML ~~LOC~~ SOLN
40.0000 mg | Freq: Every day | SUBCUTANEOUS | Status: DC
  Administered 2020-08-09 – 2020-08-13 (×5): 40 mg via SUBCUTANEOUS
  Filled 2020-08-09 (×4): qty 0.4

## 2020-08-09 MED ORDER — HYDROCHLOROTHIAZIDE 25 MG PO TABS
25.0000 mg | ORAL_TABLET | Freq: Every day | ORAL | Status: DC
  Administered 2020-08-09 – 2020-08-13 (×5): 25 mg via ORAL
  Filled 2020-08-09 (×5): qty 1

## 2020-08-09 MED ORDER — CHLORTHALIDONE 50 MG PO TABS
50.0000 mg | ORAL_TABLET | Freq: Every day | ORAL | Status: DC
  Filled 2020-08-09: qty 1

## 2020-08-09 MED ORDER — AMLODIPINE BESYLATE 10 MG PO TABS: 10.0000 mg | ORAL_TABLET | Freq: Every day | ORAL | Status: DC

## 2020-08-09 MED ORDER — LISINOPRIL 20 MG PO TABS
20.0000 mg | ORAL_TABLET | Freq: Every day | ORAL | Status: DC
  Administered 2020-08-09 – 2020-08-13 (×5): 20 mg via ORAL
  Filled 2020-08-09: qty 2
  Filled 2020-08-09 (×2): qty 1
  Filled 2020-08-09: qty 2
  Filled 2020-08-09: qty 1

## 2020-08-09 MED ORDER — ENOXAPARIN SODIUM 40 MG/0.4ML ~~LOC~~ SOLN
40.0000 mg | SUBCUTANEOUS | Status: DC
  Filled 2020-08-09: qty 0.4

## 2020-08-09 MED ORDER — MELATONIN 3 MG PO TABS
1.5000 mg | ORAL_TABLET | Freq: Every evening | ORAL | Status: AC | PRN
  Administered 2020-08-09 – 2020-08-12 (×3): 1.5 mg via ORAL
  Filled 2020-08-09 (×3): qty 1

## 2020-08-09 MED ORDER — FAMOTIDINE 20 MG PO TABS
20.0000 mg | ORAL_TABLET | Freq: Two times a day (BID) | ORAL | Status: DC
  Administered 2020-08-09 – 2020-08-13 (×9): 20 mg via ORAL
  Filled 2020-08-09 (×9): qty 1

## 2020-08-09 NOTE — Progress Notes (Signed)
eLink Physician-Brief Progress Note Patient Name: Eric Leach DOB: 10/07/1950 MRN: [id number]   Date of Service  08/09/2020  HPI/Events of Note  On camera patient is resting comfortably, repeat ABG is acceptable.  eICU Interventions  Continue BIPAP for now.        [name] 08/09/2020, 3:01 AM

## 2020-08-09 NOTE — Progress Notes (Signed)
NAME:  Eric Leach, MRN:  [id number], DOB:  17-Apr-1951, LOS: 1 ADMISSION DATE:  08/08/2020, CONSULTATION DATE:  4/16 REFERRING MD:  Calvert Cantor, CHIEF COMPLAINT:  Dyspnea   History of Present Illness:  70 y/o male admitted to [facility] on 4/13 with dyspnea, diagnosed with a COPD exacerbation due to rhinovirus and treated for the same.  After several days he was moved to the ICU, placed on BIPAP, and given ativan. PCCM called for transfer request for further evaluation.  Pertinent  Medical History  Lung cancer s/p left upper lobectomy MAI, cavitary left upper lobe mass, treated with 3 drug therapy in 2022 COPD FEV1 44% in 2021 at [facility] Cigarette smoker History of IV drug use  Significant Hospital Events: Including procedures, antibiotic start and stop dates in addition to other pertinent events   . 4/13 admitted to [facility]  4/13 SARS COV2 negative, positive for rhinovirus   4/14 CT angiogram > negative for PE, notable for LUL cavity, prior surgery LUL, emphysema noted  4/14 CT head > atrophy, no acute changes  . 4/16 transfer to [facility] for confusion, hypercapnea, need for BIPAP  Interim History / Subjective:   More awake and alert today Wants to come off BIPAP Breathing has improved  Objective   Blood pressure 118/86, pulse 93, temperature (!) 97.2 F (36.2 C), temperature source Axillary, resp. rate (!) 22, height 6' (1.829 m), weight 58.7 kg, SpO2 100 %.    Vent Mode: BIPAP FiO2 (%):  [30 %-50 %] 50 % Set Rate:  [14 bmp] 14 bmp PEEP:  [6 cmH20] 6 cmH20   Intake/Output Summary (Last 24 hours) at 08/09/2020 0804 Last data filed at 08/09/2020 0600 Gross per 24 hour  Intake --  Output 350 ml  Net -350 ml   Filed Weights   08/08/20 1830 08/09/20 0309  Weight: 58.7 kg 58.7 kg    Examination:  General:  Resting comfortably in bed HENT: NCAT OP clear ,NIMV in place PULM: CTA B, normal effort CV: RRR, no mgr GI: BS+, soft, nontender MSK: normal bulk and tone Neuro: awake,  alert today, oriented to sitaution, place, MAEW    Labs/imaging that I havepersonally reviewed  (right click and "Reselect all SmartList Selections" daily)  Chemistry 4/17 [facility] WBC 11   Resolved Hospital Problem list     Assessment & Plan:  Acute respiratory failure with hypercarbia due to a COPD exacerbation caused by rhinovirus Severe COPD with repeated exacerbations > improved 4/17 Continue solumedrol Hold antibiotics Continue brovana/pulmicort duoneb q6h prn Don't need ABG at this point   Acute metabolic encephalopathy>improved,  Unclear what caused this, possibly ativan, possibly steroid induced delirium or hypercarbia Avoid benzo Frequent orientation Haldol prn Wean off precedex  MAI infection with cavitary left upper lobe pneumonia Monitor for sputum production F/u with [facility] as outpatient  History of IV drug use Monitor for withdrawal syndrome  Hyeprtensive urgency at [facility]? (again unclear as we have no records) Wean off cardene drip for SBP < 160 Add oral anti hypertensives today: home meds (lisinopril and hctz)   Best practice (right click and "Reselect all SmartList Selections" daily)  Diet:  NPO Pain/Anxiety/Delirium protocol (if indicated): No VAP protocol (if indicated): Not indicated DVT prophylaxis: LMWH GI prophylaxis: H2B Glucose control:  SSI No Central venous access:  N/A Arterial line:  N/A Foley:  N/A Mobility:  bed rest  PT consulted: Yes Last date of multidisciplinary goals of care discussion [4/16]; Updated Eric Leach by phone on 4/17, he  confirms full code status Code Status:  full code Disposition: admit to ICU  Labs   CBC: Recent Labs  Lab 08/08/20 1856 08/09/20 0250  WBC 16.5* 12.6*  NEUTROABS 13.8*  --   HGB 15.8 14.1  HCT 48.7 43.4  MCV 87.3 86.8  PLT 303 770    Basic Metabolic Panel: Recent Labs  Lab 08/08/20 1856 08/09/20 0250  NA 140 140  K 4.1 4.5  CL 97* 100  CO2 34* 33*  GLUCOSE 153* 165*  BUN 24* 27*   CREATININE 0.90 0.80  CALCIUM 9.5 8.8*  MG 2.2 2.1  PHOS 3.2 4.9*   GFR: Estimated Creatinine Clearance: 72.4 mL/min (by C-G formula based on SCr of 0.8 mg/dL). Recent Labs  Lab 08/08/20 1856 08/09/20 0250  WBC 16.5* 12.6*    Liver Function Tests: Recent Labs  Lab 08/08/20 1856  AST 20  ALT 24  ALKPHOS 77  BILITOT 0.8  PROT 7.2  ALBUMIN 3.2*   No results for input(s): LIPASE, AMYLASE in the last 168 hours. No results for input(s): AMMONIA in the last 168 hours.  ABG    Component Value Date/Time   PHART 7.436 08/08/2020 2300   PCO2ART 54.2 (H) 08/08/2020 2300   PO2ART 79.2 (L) 08/08/2020 2300   HCO3 35.9 (H) 08/08/2020 2300   O2SAT 95.5 08/08/2020 2300     Coagulation Profile: No results for input(s): INR, PROTIME in the last 168 hours.  Cardiac Enzymes: No results for input(s): CKTOTAL, CKMB, CKMBINDEX, TROPONINI in the last 168 hours.  HbA1C: No results found for: HGBA1C  CBG: Recent Labs  Lab 08/08/20 2009 08/09/20 0001 08/09/20 0332 08/09/20 0739  GLUCAP 149* 158* 188* 151*     Critical care time: 31 minutes    Roselie Awkward, MD [place] PCCM Pager: [phone] Cell: [phone] If no response, please call [phone] until 7pm After 7:00 pm call Elink  [phone]

## 2020-08-09 NOTE — Progress Notes (Signed)
eLink Physician-Brief Progress Note Patient Name: Eric Leach DOB: [birth year] MRN: [id number]   Date of Service  08/09/2020  HPI/Events of Note  Patient is requesting a sleep aid.  eICU Interventions  Melatonin 1.5 mg Q HS PRN ordered.        [name] 08/09/2020, 10:14 PM

## 2020-08-10 DIAGNOSIS — J9601 Acute respiratory failure with hypoxia: Secondary | ICD-10-CM | POA: Diagnosis not present

## 2020-08-10 LAB — COMPREHENSIVE METABOLIC PANEL
ALT: 26 U/L (ref 0–44)
AST: 20 U/L (ref 15–41)
Albumin: 2.7 g/dL — ABNORMAL LOW (ref 3.5–5.0)
Alkaline Phosphatase: 68 U/L (ref 38–126)
Anion gap: 11 (ref 5–15)
BUN: 38 mg/dL — ABNORMAL HIGH (ref 8–23)
CO2: 27 mmol/L (ref 22–32)
Calcium: 8.8 mg/dL — ABNORMAL LOW (ref 8.9–10.3)
Chloride: 96 mmol/L — ABNORMAL LOW (ref 98–111)
Creatinine, Ser: 1.17 mg/dL (ref 0.61–1.24)
GFR, Estimated: >60 mL/min (ref >60)
Glucose, Bld: 315 mg/dL — ABNORMAL HIGH (ref 70–99)
Potassium: 4.2 mmol/L (ref 3.5–5.1)
Sodium: 134 mmol/L — ABNORMAL LOW (ref 135–145)
Total Bilirubin: 0.6 mg/dL (ref 0.3–1.2)
Total Protein: 6.2 g/dL — ABNORMAL LOW (ref 6.5–8.1)

## 2020-08-10 LAB — GLUCOSE, CAPILLARY
Glucose-Capillary: 117 mg/dL — ABNORMAL HIGH (ref 70–99)
Glucose-Capillary: 178 mg/dL — ABNORMAL HIGH (ref 70–99)
Glucose-Capillary: 201 mg/dL — ABNORMAL HIGH (ref 70–99)
Glucose-Capillary: 307 mg/dL — ABNORMAL HIGH (ref 70–99)
Glucose-Capillary: 337 mg/dL — ABNORMAL HIGH (ref 70–99)
Glucose-Capillary: 86 mg/dL (ref 70–99)
Glucose-Capillary: 88 mg/dL (ref 70–99)

## 2020-08-10 MED ORDER — HYDRALAZINE HCL 20 MG/ML IJ SOLN: 10.0000 mg | INTRAMUSCULAR | Status: DC | PRN

## 2020-08-10 MED ORDER — ORAL CARE MOUTH RINSE
15.0000 mL | Freq: Two times a day (BID) | OROMUCOSAL | Status: DC
  Administered 2020-08-11 – 2020-08-13 (×4): 15 mL via OROMUCOSAL

## 2020-08-10 MED ORDER — INSULIN ASPART 100 UNIT/ML ~~LOC~~ SOLN
0.0000 [IU] | SUBCUTANEOUS | Status: DC
  Administered 2020-08-10: 15 [IU] via SUBCUTANEOUS
  Administered 2020-08-10: 7 [IU] via SUBCUTANEOUS
  Administered 2020-08-10 – 2020-08-11 (×4): 4 [IU] via SUBCUTANEOUS

## 2020-08-10 MED ORDER — PREDNISONE 20 MG PO TABS
40.0000 mg | ORAL_TABLET | Freq: Every day | ORAL | Status: DC
  Administered 2020-08-10 – 2020-08-13 (×4): 40 mg via ORAL
  Filled 2020-08-10 (×4): qty 2

## 2020-08-10 MED ORDER — METOPROLOL TARTRATE 5 MG/5ML IV SOLN: 5.0000 mg | INTRAVENOUS | Status: DC | PRN

## 2020-08-10 MED ORDER — AMLODIPINE BESYLATE 10 MG PO TABS
10.0000 mg | ORAL_TABLET | Freq: Every day | ORAL | Status: DC
  Administered 2020-08-10 – 2020-08-13 (×4): 10 mg via ORAL
  Filled 2020-08-10 (×4): qty 1

## 2020-08-10 NOTE — Progress Notes (Signed)
Patient requested bipap for the night. Placed patient on bipap with IPAP set at 10cm and EPAP set at 5cm and oxygen set at 2lpm.

## 2020-08-10 NOTE — Progress Notes (Addendum)
PCCM INTERVAL PROGRESS NOTE  Patient hypertensive despite home antihypertensive medications already administered at increased doses. SBPs in the 170s despite this. Will add amlodipine and PRN hydralazine.       Georgann Housekeeper, AGACNP-BC [facility]  See Amion for personal pager PCCM on call pager [phone] until 7pm. Please call Elink 7p-7a. [phone]  08/10/2020 12:13 PM

## 2020-08-10 NOTE — Progress Notes (Signed)
NAME:  Eric Leach, MRN:  [id number], DOB:  [birth year], LOS: 2 ADMISSION DATE:  08/08/2020, CONSULTATION DATE:  4/16 REFERRING MD:  Calvert Cantor, CHIEF COMPLAINT:  Dyspnea   History of Present Illness:  69 y/o male admitted to [facility] on 4/13 with dyspnea, diagnosed with a COPD exacerbation due to rhinovirus and treated for the same.  After several days he was moved to the ICU, placed on BIPAP, and given ativan. PCCM called for transfer request for further evaluation.  Pertinent  Medical History  Lung cancer s/p left upper lobectomy MAI, cavitary left upper lobe mass, treated with 3 drug therapy in 2022 COPD FEV1 44% in 2021 at [facility] Cigarette smoker History of IV drug use  Significant Hospital Events: Including procedures, antibiotic start and stop dates in addition to other pertinent events   . 4/13 admitted to [facility]  4/13 SARS COV2 negative, positive for rhinovirus   4/14 CT angiogram > negative for PE, notable for LUL cavity, prior surgery LUL, emphysema noted  4/14 CT head > atrophy, no acute changes  . 4/16 transfer to [facility] for confusion, hypercapnea, need for BIPAP . 4/17 off BiPAP  Interim History / Subjective:   No significant events overnight.  Wants to eat and go home Hyperglycemia overnight.   Objective   Blood pressure (!) 152/91, pulse 92, temperature (!) 97.5 F (36.4 C), temperature source Oral, resp. rate 13, height 6' (1.829 m), weight 61.9 kg, SpO2 96 %.    FiO2 (%):  [36 %] 36 %   Intake/Output Summary (Last 24 hours) at 08/10/2020 0759 Last data filed at 08/10/2020 0700 Gross per 24 hour  Intake 632.2 ml  Output 1400 ml  Net -767.8 ml   Filed Weights   08/08/20 1830 08/09/20 0309 08/10/20 0500  Weight: 58.7 kg 58.7 kg 61.9 kg    Examination: General:  Thin elderly appearing male Neuro:  Alert, oriented, non-focal HEENT:  [place]/AT, PERRL, no JVD Cardiovascular:  RRR, no MRG Lungs:  Clear bilateral breath sounds Abdomen:  Soft, non-distended,  non-tender Musculoskeletal:  No acute deformity or ROM limitation.  Skin:  Intact, MMM   Labs/imaging that I havepersonally reviewed  (right click and "Reselect all SmartList Selections" daily)  Chemistry 4/17 OK WBC 11   Resolved Hospital Problem list     Assessment & Plan:  Acute respiratory failure with hypercarbia due to a COPD exacerbation caused by rhinovirus Severe COPD with repeated exacerbations > improved  Transition to prednisone Brovana/pulmicort Duoneb q6h prn  Acute metabolic encephalopathy>improved,  Unclear what caused this, possibly ativan, possibly steroid induced delirium or hypercarbia Frequent orientation Avoid benzo PRN haldol Wean precedex to off today, clonidine taper if necessary  MAI infection with cavitary left upper lobe pneumonia Monitor for sputum production F/u with [facility] as outpatient  History of IV drug use Monitor for withdrawal syndrome  Hyeprtensive urgency at [facility]? (again unclear as we have no records) Continue home lisinopril and HCTZ May need to add another agent.   Steroid induced hyperglycemia:  No history of DM - start SSI today - Steroid dose wean today.    Best practice (right click and "Reselect all SmartList Selections" daily)  Diet:  Oral Pain/Anxiety/Delirium protocol (if indicated): No VAP protocol (if indicated): Not indicated DVT prophylaxis: LMWH GI prophylaxis: H2B Glucose control:  SSI Yes Central venous access:  N/A Arterial line:  N/A Foley:  N/A Mobility:  bed rest  PT consulted: Yes Last date of multidisciplinary goals of care discussion [4/16];  Updated Eric Leach by phone on 4/17, he confirms full code status Code Status:  full code Disposition: ICU  Labs   CBC: Recent Labs  Lab 08/08/20 1856 08/09/20 0250  WBC 16.5* 12.6*  NEUTROABS 13.8*  --   HGB 15.8 14.1  HCT 48.7 43.4  MCV 87.3 86.8  PLT 303 876    Basic Metabolic Panel: Recent Labs  Lab 08/08/20 1856 08/09/20 0250  08/10/20 0312  NA 140 140 134*  K 4.1 4.5 4.2  CL 97* 100 96*  CO2 34* 33* 27  GLUCOSE 153* 165* 315*  BUN 24* 27* 38*  CREATININE 0.90 0.80 1.17  CALCIUM 9.5 8.8* 8.8*  MG 2.2 2.1  --   PHOS 3.2 4.9*  --    GFR: Estimated Creatinine Clearance: 52.2 mL/min (by C-G formula based on SCr of 1.17 mg/dL). Recent Labs  Lab 08/08/20 1856 08/09/20 0250  WBC 16.5* 12.6*    Liver Function Tests: Recent Labs  Lab 08/08/20 1856 08/10/20 0312  AST 20 20  ALT 24 26  ALKPHOS 77 68  BILITOT 0.8 0.6  PROT 7.2 6.2*  ALBUMIN 3.2* 2.7*   No results for input(s): LIPASE, AMYLASE in the last 168 hours. No results for input(s): AMMONIA in the last 168 hours.  ABG    Component Value Date/Time   PHART 7.436 08/08/2020 2300   PCO2ART 54.2 (H) 08/08/2020 2300   PO2ART 79.2 (L) 08/08/2020 2300   HCO3 35.9 (H) 08/08/2020 2300   O2SAT 95.5 08/08/2020 2300     Coagulation Profile: No results for input(s): INR, PROTIME in the last 168 hours.  Cardiac Enzymes: No results for input(s): CKTOTAL, CKMB, CKMBINDEX, TROPONINI in the last 168 hours.  HbA1C: No results found for: HGBA1C  CBG: Recent Labs  Lab 08/09/20 0739 08/09/20 1149 08/09/20 1946 08/09/20 2312 08/10/20 0319  GLUCAP 151* 157* 214* 208* 307*     Critical care time: 34 minutes. Critical care time indicated due to encephalopahty requiring Precedex infusion, severe COPD exacerbation     Georgann Housekeeper, AGACNP-BC [facility]  See Amion for personal pager PCCM on call pager [phone] until 7pm. Please call Elink 7p-7a. [phone]  08/10/2020 8:06 AM

## 2020-08-10 NOTE — Progress Notes (Signed)
   08/10/20 1410  Vitals  Temp 98.9 F (37.2 C)  Temp Source Oral  BP (!) 154/87  MAP (mmHg) 102  BP Location Left Arm  BP Method Automatic  Patient Position (if appropriate) Lying  Pulse Rate (!) 105  Pulse Rate Source Monitor  Resp 12  MEWS COLOR  MEWS Score Color Yellow  Oxygen Therapy  SpO2 97 %  O2 Device Nasal Cannula  O2 Flow Rate (L/min) 2 L/min  Pain Assessment  Pain Scale 0-10  Pain Score 0  MEWS Score  MEWS Temp 0  MEWS Systolic 0  MEWS Pulse 1  MEWS RR 1  MEWS LOC 0  MEWS Score 2  Provider Notification  Provider Name/Title Olalere MD  Date Provider Notified 08/10/20  Time Provider Notified 2979  Notification Type Face-to-face  Notification Reason Change in status (Yellow MUSE)  Provider response No new orders  Date of Provider Response 08/10/20  Time of Provider Response 1411  MD and Charge RN Cassie made aware of Yellow MUSE status. VSS taken per yellow muse protocol. Pt safety maintained.

## 2020-08-10 NOTE — TOC Initial Note (Signed)
Transition of Care [facility]) - Initial/Assessment Note    Patient Details  Name: Eric Leach MRN: [id number] Date of Birth: 1950-11-17  Transition of [facility]) CM/SW Contact:    Leeroy Cha, RN Phone Number: 08/10/2020, 8:17 AM  Clinical Narrative:                 70 y/o male admitted to [facility] on 4/13 with dyspnea, diagnosed with a COPD exacerbation and treated for the same.  After several days he was moved to the ICU, placed on BIPAP, and given ativan. PCCM called for transfer request for further evaluation. The patient was unable to provide history due to confusion and somnolence.   We know he was put on BIPAP today but there is no clear reason what lead to this.  The patient was also given ativan several times.  [facility] physicians requested the patient be intubated prior to transfer but the [facility] physicians felt this was not necessary.   There were not physician's notes provided by [facility]. History was obtained from the patient's son who says he spoke to his father once during the hospital visit and said he sounded confused.  The son says he thinks his father uses drugs but he doesn't think he drinks.  He says he has been admitted several times for breathing difficulty this year, and uses a nebulizer at home.  He says his father still smokes cigarettes. The son doesn't know if his father is vaccinated against COVID. Chart review shows that the patient tested negative for COVID on 4/13 but he was positive for rhinovirus. plan is to return to previous level of self care.  Lives [place], has a son in the area, lives in his own apt.  Expected Discharge Plan: Home/Self Care Barriers to Discharge: Continued Medical Work up   Patient Goals and CMS Choice Patient states their goals for this hospitalization and ongoing recovery are:: not stated   Choice offered to / list presented to : Patient  Expected Discharge Plan and Services Expected Discharge Plan: Home/Self Care   Discharge  Planning Services: CM Consult   Living arrangements for the past 2 months: Apartment                                      Prior Living Arrangements/Services Living arrangements for the past 2 months: Apartment Lives with:: Self                   Activities of Daily Living      Permission Sought/Granted                  Emotional Assessment   Attitude/Demeanor/Rapport: Unable to Assess Affect (typically observed): Unable to Assess Orientation: : Fluctuating Orientation (Suspected and/or reported Sundowners) Alcohol / Substance Use: Alcohol Use Psych Involvement: No (comment)  Admission diagnosis:  Pneumonia [J18.9] Hypoxia [R09.02] Acute respiratory failure with hypoxemia ([place]) [J96.01] Patient Active Problem List   Diagnosis Date Noted  . Acute respiratory failure with hypoxemia ([place]) 08/08/2020  . Mycobacterium avium complex ([place]) 03/12/2019  . Mixed simple and mucopurulent chronic bronchitis ([place]) 03/12/2019  . Erectile dysfunction 09/19/2018  . Essential hypertension 09/19/2018   PCP:  [name], MD Pharmacy:   [facility] [address] [place] [address] Phone: [phone] Fax: [phone]  [id number] [place] [phone]  [place] [address] Phone: [phone] Fax: [phone]  [facility] [place] [address] [place] [address] Phone: [phone] Fax: [place], [address] [place] [address] Phone: [phone] Fax: [phone]     Social Determinants of Health (SDOH) Interventions    Readmission Risk Interventions No flowsheet data found.

## 2020-08-10 NOTE — Plan of Care (Signed)
  Problem: Clinical Measurements: Goal: Ability to maintain clinical measurements within normal limits will improve Outcome: Progressing Goal: Respiratory complications will improve Outcome: Progressing   Problem: Coping: Goal: Level of anxiety will decrease Outcome: Progressing   Problem: Elimination: Goal: Will not experience complications related to bowel motility Outcome: Progressing   Problem: Safety: Goal: Ability to remain free from injury will improve Outcome: Progressing

## 2020-08-11 ENCOUNTER — Encounter (HOSPITAL_COMMUNITY): Payer: Self-pay | Admitting: Pulmonary Disease

## 2020-08-11 ENCOUNTER — Other Ambulatory Visit: Payer: Self-pay

## 2020-08-11 LAB — CBC
HCT: 45.3 % (ref 39.0–52.0)
Hemoglobin: 15.8 g/dL (ref 13.0–17.0)
MCH: 28.4 pg (ref 26.0–34.0)
MCHC: 34.9 g/dL (ref 30.0–36.0)
MCV: 81.3 fL (ref 80.0–100.0)
Platelets: 232 10*3/uL (ref 150–400)
RBC: 5.57 MIL/uL (ref 4.22–5.81)
RDW: 12.7 % (ref 11.5–15.5)
WBC: 18.2 10*3/uL — ABNORMAL HIGH (ref 4.0–10.5)
nRBC: 0 % (ref 0.0–0.2)

## 2020-08-11 LAB — HEMOGLOBIN A1C
Hgb A1c MFr Bld: 7 % — ABNORMAL HIGH (ref 4.8–5.6)
Mean Plasma Glucose: 154 mg/dL

## 2020-08-11 LAB — GLUCOSE, CAPILLARY
Glucose-Capillary: 190 mg/dL — ABNORMAL HIGH (ref 70–99)
Glucose-Capillary: 159 mg/dL — ABNORMAL HIGH (ref 70–99)
Glucose-Capillary: 165 mg/dL — ABNORMAL HIGH (ref 70–99)
Glucose-Capillary: 306 mg/dL — ABNORMAL HIGH (ref 70–99)
Glucose-Capillary: 183 mg/dL — ABNORMAL HIGH (ref 70–99)

## 2020-08-11 LAB — BASIC METABOLIC PANEL
Anion gap: 10 (ref 5–15)
BUN: 23 mg/dL (ref 8–23)
CO2: 29 mmol/L (ref 22–32)
Calcium: 8.9 mg/dL (ref 8.9–10.3)
Chloride: 95 mmol/L — ABNORMAL LOW (ref 98–111)
Creatinine, Ser: 0.82 mg/dL (ref 0.61–1.24)
GFR, Estimated: >60 mL/min (ref >60)
Glucose, Bld: 157 mg/dL — ABNORMAL HIGH (ref 70–99)
Potassium: 3.4 mmol/L — ABNORMAL LOW (ref 3.5–5.1)
Sodium: 134 mmol/L — ABNORMAL LOW (ref 135–145)

## 2020-08-11 MED ORDER — INSULIN ASPART 100 UNIT/ML ~~LOC~~ SOLN
0.0000 [IU] | Freq: Three times a day (TID) | SUBCUTANEOUS | Status: DC
  Administered 2020-08-11: 15 [IU] via SUBCUTANEOUS
  Administered 2020-08-12: 11 [IU] via SUBCUTANEOUS
  Administered 2020-08-12 (×2): 4 [IU] via SUBCUTANEOUS
  Administered 2020-08-13: 3 [IU] via SUBCUTANEOUS
  Administered 2020-08-13: 4 [IU] via SUBCUTANEOUS

## 2020-08-11 MED ORDER — POTASSIUM CHLORIDE CRYS ER 20 MEQ PO TBCR
40.0000 meq | EXTENDED_RELEASE_TABLET | Freq: Once | ORAL | Status: AC
  Administered 2020-08-11: 40 meq via ORAL
  Filled 2020-08-11: qty 2

## 2020-08-11 MED ORDER — LIVING WELL WITH DIABETES BOOK
Freq: Once | Status: AC
  Filled 2020-08-11: qty 1

## 2020-08-11 NOTE — Care Management Important Message (Signed)
Important Message  Patient Details IM Letter given to the Patient. Name: Eric Leach MRN: [id number] Date of Birth: 1950/07/29   Medicare Important Message Given:  Yes     Kerin Salen 08/11/2020, 10:32 AM

## 2020-08-11 NOTE — Evaluation (Signed)
Occupational Therapy Evaluation Patient Details Name: EGE MUCKEY MRN: [id number] DOB: 01/08/1951 Today's Date: 08/11/2020    History of Present Illness SUVAN STCYR II is a 70 year old male who transferred from [facility] on 4/16.  He was originally admitted at [facility] on 4/13 with dyspnea, diagnosed with COPD exacerbation secondary to rhinovirus.  After several days, he was transferred to ICU, placed on BiPAP, eventually transferred to [facility] long hospital.   Clinical Impression   Mr. Renly Roots is a 70 year old who presents with decreased activity tolerance and impaired cardiopulmonary endurance. Patient independent at baseline though details in regards to PLOF and home environment minimal as patient became somewhat defensive and mildly verbally aggressive with questioning.  On evaluation patient supervision to stand and transfer to recliner. Reports going to the bathroom with nursing and not having assistance for toileting. Patient's mood labile from friendly to agitated and his conversation tangential. Evaluation limited as he reports "not liking being told what to do" and therapist did not force further participation. However, patient agreeable for therapist to return during hospital stay. Patient will benefit from skilled OT services while in hospital to improve deficits and learn compensatory strategies as needed in order to discharge home. Anticipate no OT needs at discharge.        Follow Up Recommendations  No OT follow up    Equipment Recommendations  Other (comment) (TBD)    Recommendations for Other Services       Precautions / Restrictions Precautions Precaution Comments: Monitor sats Restrictions Weight Bearing Restrictions: No      Mobility Bed Mobility Overal bed mobility: Independent                  Transfers Overall transfer level: Needs assistance Equipment used: None Transfers: Sit to/from Stand;Stand Pivot Transfers Sit to  Stand: Supervision Stand pivot transfers: Supervision            Balance Overall balance assessment: No apparent balance deficits (not formally assessed)                                         ADL either performed or assessed with clinical judgement   ADL Overall ADL's : Needs assistance/impaired Eating/Feeding: Independent   Grooming: Supervision/safety   Upper Body Bathing: Supervision/ safety   Lower Body Bathing: Supervison/ safety   Upper Body Dressing : Supervision/safety   Lower Body Dressing: Supervision/safety   Toilet Transfer: Supervision/safety   Toileting- Clothing Manipulation and Hygiene: Supervision/safety       Functional mobility during ADLs: Min guard;Supervision/safety       Vision   Vision Assessment?: No apparent visual deficits     Perception     Praxis      Pertinent Vitals/Pain Pain Assessment: No/denies pain     Hand Dominance     Extremity/Trunk Assessment Upper Extremity Assessment Upper Extremity Assessment: Overall WFL for tasks assessed   Lower Extremity Assessment Lower Extremity Assessment: Defer to PT evaluation   Cervical / Trunk Assessment Cervical / Trunk Assessment: Normal   Communication Communication Communication: No difficulties   Cognition Arousal/Alertness: Awake/alert Behavior During Therapy: WFL for tasks assessed/performed;Agitated Overall Cognitive Status: Within Functional Limits for tasks assessed  General Comments: Patient's affect labile between friendly and agitated. Reports not liking being told what to do, states he is educated and has a college degree, curses at times.   General Comments       Exercises     Shoulder Instructions      Home Living Family/patient expects to be discharged to:: Unsure Living Arrangements: Alone                               Additional Comments: Patient reports recently having  an apartment but left it. Patient became defensive and mildly aggressive when asking questions regarding discharge venue and other PLOF questions.      Prior Functioning/Environment Level of Independence: Independent                 OT Problem List: Decreased activity tolerance;Cardiopulmonary status limiting activity      OT Treatment/Interventions: Self-care/ADL training;DME and/or AE instruction;Therapeutic activities;Energy conservation;Patient/family education    OT Goals(Current goals can be found in the care plan section) Acute Rehab OT Goals Patient Stated Goal: Did not state OT Goal Formulation: With patient Time For Goal Achievement: 08/25/20 Potential to Achieve Goals: Good  OT Frequency: Min 1X/week   Barriers to D/C:            Co-evaluation              AM-PAC OT "6 Clicks" Daily Activity     Outcome Measure Help from another person eating meals?: None Help from another person taking care of personal grooming?: A Little Help from another person toileting, which includes using toliet, bedpan, or urinal?: A Little Help from another person bathing (including washing, rinsing, drying)?: A Little Help from another person to put on and taking off regular upper body clothing?: A Little Help from another person to put on and taking off regular lower body clothing?: A Little 6 Click Score: 19   End of Session Nurse Communication: Mobility status  Activity Tolerance: Patient tolerated treatment well Patient left: with call bell/phone within reach;with chair alarm set  OT Visit Diagnosis: Muscle weakness (generalized) (M62.81)                Time: 1287-8676 OT Time Calculation (min): 15 min Charges:  OT General Charges $OT Visit: 1 Visit OT Evaluation $OT Eval Low Complexity: 1 Low  Ziyonna Christner, OTR/L [place]  Office [phone] Pager: [phone] 08/11/2020, 3:30 PM

## 2020-08-11 NOTE — Progress Notes (Signed)
Pt does not feel he needs BiPAP at this time.  Pt stable and comfortable at this time on RA.

## 2020-08-11 NOTE — Progress Notes (Signed)
Inpatient Diabetes Program Recommendations  AACE/ADA: New Consensus Statement on Inpatient Glycemic Control (2015)  Target Ranges:  Prepandial:   less than 140 mg/dL      Peak postprandial:   less than 180 mg/dL (1-2 hours)      Critically ill patients:  140 - 180 mg/dL   Lab Results  Component Value Date   GLUCAP 165 (H) 08/11/2020   HGBA1C 7.0 (H) 08/10/2020    Review of Glycemic Control Results for Eric Leach, Eric Leach (MRN [id number]) as of 08/11/2020 13:00  Ref. Range 08/10/2020 19:47 08/10/2020 23:34 08/11/2020 03:37 08/11/2020 08:01 08/11/2020 11:57  Glucose-Capillary Latest Ref Range: 70 - 99 mg/dL 178 (H) 117 (H) 190 (H) 159 (H) 165 (H)   Diabetes history: None Outpatient Diabetes medications: None Current orders for Inpatient glycemic control:  Novolog resistant tid with meals Prednisone 40 mg daily  Inpatient Diabetes Program Recommendations:    Spoke with patient regarding A1C of 7%.  Explained that this is elevated however with recent/current steroids for breathing, may not be accurate.  Encouraged f/u with PCP however he states that he needs a new Dr. [facility] since he moved.   Discussed basic principals of DM and discussed affects of CHO/sugars affect on glucose levels.  Encouraged him to avoid sugar in beverages and to limit serving sizes of CHO.  Patient interested in DM booklet, stating "I love to learn".  We discussed normal blood glucose levels of 80-130 and the long term effects of uncontrolled DM.  Patient appreciative of information.  Will order TOC for possible assistance in helping patient find PCP that is close to his home.   Thanks,  Adah Perl, RN, BC-ADM Inpatient Diabetes Coordinator Pager [phone] (8a-5p)

## 2020-08-11 NOTE — Evaluation (Addendum)
Physical Therapy Evaluation Patient Details Name: Eric Leach MRN: [id number] DOB: 1951/01/31 Today's Date: 08/11/2020   History of Present Illness  Eric Leach is a 70 year old male who transferred from [facility] on 4/16.  He was originally admitted at [facility] on 4/13 with dyspnea, diagnosed with COPD exacerbation secondary to rhinovirus.  After several days, he was transferred to ICU, placed on BiPAP, eventually transferred to [facility].  Clinical Impression  On eval, pt was very close Min guard-Min A for mobility. He walked ~125 feet. O2 93% on RA. Unsteady with near LOB multiple times. Poor safety awareness. Uncooperative/mildly agitated at times. Will attempt to follow and progress activity as pt will allow.      Follow Up Recommendations Supervision for mobility/OOB    Equipment Recommendations  None recommended by PT    Recommendations for Other Services       Precautions / Restrictions Precautions Precautions: Fall Precaution Comments: Monitor sats; can get mildly agitated Restrictions Weight Bearing Restrictions: No      Mobility  Bed Mobility Overal bed mobility: Needs Assistance Bed Mobility: Supine to Sit     Supine to sit: Supervision     General bed mobility comments: for safety, lines. impulsive.    Transfers Overall transfer level: Needs assistance Equipment used: None Transfers: Sit to/from Stand Sit to Stand: Min guard Stand pivot transfers: Supervision       General transfer comment: unsteady.  Ambulation/Gait Ambulation/Gait assistance: Min guard;Min Assist Gait Distance (Feet): 125 Feet   Gait Pattern/deviations: Step-through pattern     General Gait Details: "you don't have to hold me". unsteady with near LOB multiple times. O2 93% on RA.  Stairs            Wheelchair Mobility    Modified Rankin (Stroke Patients Only)       Balance Overall balance assessment: Needs assistance            Standing balance-Leahy Scale: Fair Standing balance comment: unable to formally assess 2* pt's uncooperative nature                             Pertinent Vitals/Pain Pain Assessment: No/denies pain    Home Living Family/patient expects to be discharged to:: Unsure Living Arrangements: Alone               Additional Comments: Patient reports recently having an apartment but left it. Patient became defensive and mildly aggressive when asking questions regarding discharge venue and other PLOF questions.    Prior Function Level of Independence: Independent               Hand Dominance        Extremity/Trunk Assessment   Upper Extremity Assessment Upper Extremity Assessment: Defer to OT evaluation    Lower Extremity Assessment Lower Extremity Assessment: Overall WFL for tasks assessed    Cervical / Trunk Assessment Cervical / Trunk Assessment: Normal  Communication   Communication: No difficulties  Cognition Arousal/Alertness: Awake/alert Behavior During Therapy:  (see general comments) Overall Cognitive Status: No family/caregiver present to determine baseline cognitive functioning Area of Impairment: Safety/judgement                               General Comments: Patient's affect labile between friendly and agitated. Reports not liking being told what to do, states he is educated and has  a college degree, curses at times, conversation tangential. Poor safety awareness      General Comments      Exercises     Assessment/Plan    PT Assessment Patient needs continued PT services  PT Problem List Decreased balance;Decreased safety awareness       PT Treatment Interventions Gait training;Therapeutic exercise;Balance training;Therapeutic activities;Patient/family education;Functional mobility training    PT Goals (Current goals can be found in the Care Plan section)  Acute Rehab PT Goals Patient Stated Goal: Did not  state PT Goal Formulation: With patient Time For Goal Achievement: 08/25/20 Potential to Achieve Goals: Good    Frequency Min 3X/week   Barriers to discharge        Co-evaluation               AM-PAC PT "6 Clicks" Mobility  Outcome Measure Help needed turning from your back to your side while in a flat bed without using bedrails?: None Help needed moving from lying on your back to sitting on the side of a flat bed without using bedrails?: None Help needed moving to and from a bed to a chair (including a wheelchair)?: A Little Help needed standing up from a chair using your arms (e.g., wheelchair or bedside chair)?: A Little Help needed to walk in hospital room?: A Little Help needed climbing 3-5 steps with a railing? : A Little 6 Click Score: 20    End of Session   Activity Tolerance: Patient tolerated treatment well Patient left: in chair;with chair alarm set;with call bell/phone within reach   PT Visit Diagnosis: Unsteadiness on feet (R26.81)    Time: 9678-9381 PT Time Calculation (min) (ACUTE ONLY): 10 min   Charges:   PT Evaluation $PT Eval Moderate Complexity: 1 Mod            Doreatha Massed, PT Acute Rehabilitation  Office: [phone] Pager: [phone]

## 2020-08-11 NOTE — Progress Notes (Signed)
PROGRESS NOTE    KIA STAVROS  Eric Leach:[id number] DOB: 05/25/1950 DOA: 08/08/2020 PCP: [name], MD     Brief Narrative:  ISTVAN BEHAR Leach is a 70 year old male who transferred from [facility] on 4/16.  He was originally admitted at [facility] on 4/13 with dyspnea, diagnosed with COPD exacerbation secondary to rhinovirus.  After several days, he was transferred to ICU, placed on BiPAP, eventually transferred to [facility] long hospital under ICU team.  [facility] events as below:  4/13 admitted to [facility]  4/13 SARS COV2 negative, positive for rhinovirus   4/14 CT angiogram > negative for PE, notable for LUL cavity, prior surgery LUL, emphysema noted  4/14 CT head > atrophy, no acute changes   4/16 transfer to [facility] for confusion, hypercapnea, need for BIPAP  4/17 off BiPAP  4/19 transferred to [facility] service  New events last 24 hours / Subjective: No acute events, admits to dry cough.  Remains on room air, denies worsening shortness of breath.  Very poor historian.  Assessment & Plan:   Active Problems:   Acute respiratory failure with hypoxemia (HCC)   Acute hypoxemic/hypercarbic respiratory failure -Was on BiPAP on admission, currently on room air  COPD exacerbation secondary to rhinovirus -Transitioned to prednisone, continue Brovana/Pulmicort, DuoNeb -Appears that patient was treated with Rocephin, azithromycin prior to transfer to [facility] long hospital  Acute metabolic encephalopathy -Thought to be possibly due to Ativan use, possibly steroid induced delirium or hypercarbia -Now off Precedex  History of IV drug abuse -Prior history of IV fentanyl, heroin use  Hypertensive urgency -Continue Norvasc, lisinopril, HCTZ  History of MAC -CTA chest from [facility] showed stable cavitary lesion in the left lung apex likely sequela of remote infection, he was diagnosed with MAC in the past, followed by [facility]  pulmonology  Steroid-induced hyperglycemia -Hemoglobin A1c 7.0 -Sliding scale insulin -Likely will need oral agent on discharge  Hypokalemia -Replace, trend  Leukocytosis -Likely in setting of steroid use, monitor    DVT prophylaxis:  enoxaparin (LOVENOX) injection 40 mg Start: 08/09/20 1824 SCDs Start: 08/08/20 1832  Code Status: Full code Family Communication: No family at bedside Disposition Plan:  Status is: Inpatient  Remains inpatient appropriate because:Unsafe d/c plan and Inpatient level of care appropriate due to severity of illness   Dispo: The patient is from: Home              Anticipated d/c is to: Home              Patient currently is not medically stable to d/c.  Need PT OT evaluation   Difficult to place patient No    Antimicrobials:  Anti-infectives (From admission, onward)   None        Objective: Vitals:   08/11/20 0700 08/11/20 0800 08/11/20 0806 08/11/20 1158  BP:   (!) 159/95 (!) 141/96  Pulse:   95 (!) 109  Resp:   19 19  Temp:   97.9 F (36.6 C) 98.3 F (36.8 C)  TempSrc:   Oral Oral  SpO2: 94% 97% 99% 93%  Weight:      Height:        Intake/Output Summary (Last 24 hours) at 08/11/2020 1236 Last data filed at 08/11/2020 1117 Gross per 24 hour  Intake 720 ml  Output 2800 ml  Net -2080 ml   Filed Weights   08/08/20 1830 08/09/20 0309 08/10/20 0500  Weight: 58.7 kg 58.7 kg 61.9 kg  Examination:  General exam: Appears calm and comfortable  Respiratory system: Clear to auscultation. Respiratory effort normal. No respiratory distress. No conversational dyspnea. On room air  Cardiovascular system: S1 & S2 heard, tachycardic, regular rhythm. No murmurs. No pedal edema. Gastrointestinal system: Abdomen is nondistended, soft and nontender. Normal bowel sounds heard. Central nervous system: Alert and oriented. No focal neurological deficits. Speech clear.  Extremities: Symmetric in appearance  Skin: No rashes, lesions or ulcers  on exposed skin  Psychiatry: Judgement and insight appear poor  Data Reviewed: I have personally reviewed following labs and imaging studies  CBC: Recent Labs  Lab 08/08/20 1856 08/09/20 0250 08/11/20 0514  WBC 16.5* 12.6* 18.2*  NEUTROABS 13.8*  --   --   HGB 15.8 14.1 15.8  HCT 48.7 43.4 45.3  MCV 87.3 86.8 81.3  PLT 303 264 161   Basic Metabolic Panel: Recent Labs  Lab 08/08/20 1856 08/09/20 0250 08/10/20 0312 08/11/20 0514  NA 140 140 134* 134*  K 4.1 4.5 4.2 3.4*  CL 97* 100 96* 95*  CO2 34* 33* 27 29  GLUCOSE 153* 165* 315* 157*  BUN 24* 27* 38* 23  CREATININE 0.90 0.80 1.17 0.82  CALCIUM 9.5 8.8* 8.8* 8.9  MG 2.2 2.1  --   --   PHOS 3.2 4.9*  --   --    GFR: Estimated Creatinine Clearance: 74.4 mL/min (by C-G formula based on SCr of 0.82 mg/dL). Liver Function Tests: Recent Labs  Lab 08/08/20 1856 08/10/20 0312  AST 20 20  ALT 24 26  ALKPHOS 77 68  BILITOT 0.8 0.6  PROT 7.2 6.2*  ALBUMIN 3.2* 2.7*   No results for input(s): LIPASE, AMYLASE in the last 168 hours. No results for input(s): AMMONIA in the last 168 hours. Coagulation Profile: No results for input(s): INR, PROTIME in the last 168 hours. Cardiac Enzymes: No results for input(s): CKTOTAL, CKMB, CKMBINDEX, TROPONINI in the last 168 hours. BNP (last 3 results) No results for input(s): PROBNP in the last 8760 hours. HbA1C: Recent Labs    08/10/20 0836  HGBA1C 7.0*   CBG: Recent Labs  Lab 08/10/20 1947 08/10/20 2334 08/11/20 0337 08/11/20 0801 08/11/20 1157  GLUCAP 178* 117* 190* 159* 165*   Lipid Profile: No results for input(s): CHOL, HDL, LDLCALC, TRIG, CHOLHDL, LDLDIRECT in the last 72 hours. Thyroid Function Tests: No results for input(s): TSH, T4TOTAL, FREET4, T3FREE, THYROIDAB in the last 72 hours. Anemia Panel: No results for input(s): VITAMINB12, FOLATE, FERRITIN, TIBC, IRON, RETICCTPCT in the last 72 hours. Sepsis Labs: No results for input(s): PROCALCITON,  LATICACIDVEN in the last 168 hours.  Recent Results (from the past 240 hour(s))  MRSA PCR Screening     Status: None   Collection Time: 08/08/20  6:26 PM   Specimen: Nasal Mucosa; Nasopharyngeal  Result Value Ref Range Status   MRSA by PCR NEGATIVE NEGATIVE Final    Comment:        The GeneXpert MRSA Assay (FDA approved for NASAL specimens only), is one component of a comprehensive MRSA colonization surveillance program. It is not intended to diagnose MRSA infection nor to guide or monitor treatment for MRSA infections. Performed at [facility], [place] [address] [place], [place] [address]       Radiology Studies: No results found.    Scheduled Meds: . amLODipine  10 mg Oral Daily  . arformoterol  15 mcg Nebulization BID  . budesonide (PULMICORT) nebulizer solution  0.5 mg Nebulization BID  .  enoxaparin (LOVENOX) injection  40 mg Subcutaneous Daily  . famotidine  20 mg Oral BID  . hydrochlorothiazide  25 mg Oral Daily  . insulin aspart  0-20 Units Subcutaneous Q4H  . lisinopril  20 mg Oral Daily  . mouth rinse  15 mL Mouth Rinse BID  . predniSONE  40 mg Oral Q breakfast   Continuous Infusions:    LOS: 3 days      Time spent: 25 minutes   Dessa Phi, DO [facility] 08/11/2020, 12:36 PM   Available via Epic secure chat 7am-7pm After these hours, please refer to coverage provider listed on [email or web address]

## 2020-08-12 DIAGNOSIS — D72829 Elevated white blood cell count, unspecified: Secondary | ICD-10-CM

## 2020-08-12 DIAGNOSIS — F1911 Other psychoactive substance abuse, in remission: Secondary | ICD-10-CM

## 2020-08-12 DIAGNOSIS — E119 Type 2 diabetes mellitus without complications: Secondary | ICD-10-CM

## 2020-08-12 DIAGNOSIS — G9341 Metabolic encephalopathy: Secondary | ICD-10-CM

## 2020-08-12 DIAGNOSIS — R739 Hyperglycemia, unspecified: Secondary | ICD-10-CM

## 2020-08-12 DIAGNOSIS — I160 Hypertensive urgency: Secondary | ICD-10-CM

## 2020-08-12 DIAGNOSIS — E876 Hypokalemia: Secondary | ICD-10-CM

## 2020-08-12 HISTORY — DX: Metabolic encephalopathy: G93.41

## 2020-08-12 HISTORY — DX: Hypertensive urgency: I16.0

## 2020-08-12 LAB — BASIC METABOLIC PANEL
Anion gap: 9 (ref 5–15)
BUN: 21 mg/dL (ref 8–23)
CO2: 30 mmol/L (ref 22–32)
Calcium: 8.9 mg/dL (ref 8.9–10.3)
Chloride: 94 mmol/L — ABNORMAL LOW (ref 98–111)
Creatinine, Ser: 0.8 mg/dL (ref 0.61–1.24)
GFR, Estimated: >60 mL/min (ref >60)
Glucose, Bld: 177 mg/dL — ABNORMAL HIGH (ref 70–99)
Potassium: 3.7 mmol/L (ref 3.5–5.1)
Sodium: 133 mmol/L — ABNORMAL LOW (ref 135–145)

## 2020-08-12 LAB — GLUCOSE, CAPILLARY
Glucose-Capillary: 162 mg/dL — ABNORMAL HIGH (ref 70–99)
Glucose-Capillary: 165 mg/dL — ABNORMAL HIGH (ref 70–99)
Glucose-Capillary: 168 mg/dL — ABNORMAL HIGH (ref 70–99)
Glucose-Capillary: 170 mg/dL — ABNORMAL HIGH (ref 70–99)
Glucose-Capillary: 177 mg/dL — ABNORMAL HIGH (ref 70–99)
Glucose-Capillary: 270 mg/dL — ABNORMAL HIGH (ref 70–99)

## 2020-08-12 LAB — CBC
HCT: 46.1 % (ref 39.0–52.0)
Hemoglobin: 16 g/dL (ref 13.0–17.0)
MCH: 28.5 pg (ref 26.0–34.0)
MCHC: 34.7 g/dL (ref 30.0–36.0)
MCV: 82.2 fL (ref 80.0–100.0)
Platelets: 213 10*3/uL (ref 150–400)
RBC: 5.61 MIL/uL (ref 4.22–5.81)
RDW: 13.2 % (ref 11.5–15.5)
WBC: 19.3 10*3/uL — ABNORMAL HIGH (ref 4.0–10.5)
nRBC: 0 % (ref 0.0–0.2)

## 2020-08-12 LAB — MAGNESIUM: Magnesium: 1.6 mg/dL — ABNORMAL LOW (ref 1.7–2.4)

## 2020-08-12 MED ORDER — MAGNESIUM SULFATE 2 GM/50ML IV SOLN
2.0000 g | Freq: Once | INTRAVENOUS | Status: AC
  Administered 2020-08-12: 2 g via INTRAVENOUS
  Filled 2020-08-12: qty 50

## 2020-08-12 NOTE — Discharge Instructions (Signed)

## 2020-08-12 NOTE — Progress Notes (Signed)
Pt alert and aware sitting on edge of bed a bit aggravated. He said he had called down to the kitchen several times and they have not yet brought his food.  The chaplain offered caring and calming presence. Pt's meal did come while I was there.

## 2020-08-12 NOTE — Progress Notes (Signed)
Patient refuses nocturnal PAP tonight. Equipment remains at bedside.

## 2020-08-12 NOTE — Progress Notes (Signed)
PROGRESS NOTE    Eric Leach  PXT:[id number] DOB: 1950/11/25 DOA: 08/08/2020 PCP: [name], MD     Brief Narrative:  Eric Leach is a 70 year old male who transferred from [facility] on 4/16.  He was originally admitted at [facility] on 4/13 with dyspnea, diagnosed with COPD exacerbation secondary to rhinovirus.  After several days, he was transferred to ICU, placed on BiPAP, eventually transferred to [facility] long hospital under ICU team.  [facility] events as below:  4/13 admitted to [facility]  4/13 SARS COV2 negative, positive for rhinovirus   4/14 CT angiogram > negative for PE, notable for LUL cavity, prior surgery LUL, emphysema noted  4/14 CT head > atrophy, no acute changes   4/16 transfer to [facility] for confusion, hypercapnea, need for BIPAP  4/17 off BiPAP  4/19 transferred to [facility] service  New events last 24 hours / Subjective: Sitting in chair, very animated with his speech today. No physical complaints.  He was put back on 2 L oxygen after his breathing treatment by respiratory therapist this morning.  Assessment & Plan:   Principal Problem:   Acute respiratory failure with hypoxemia (HCC) Active Problems:   Essential hypertension   Acute metabolic encephalopathy   History of drug abuse ([place])   Hypertensive urgency   Hyperglycemia   Type 2 diabetes mellitus without complication (HCC)   Hypomagnesemia   Hypokalemia   Leukocytosis   Acute hypoxemic/hypercarbic respiratory failure -Was on BiPAP on admission, currently on 2 L oxygen, weaned to room air  COPD exacerbation secondary to rhinovirus -Transitioned to prednisone, continue Brovana/Pulmicort, DuoNeb -Appears that patient was treated with Rocephin, azithromycin prior to transfer to [facility] long hospital  Acute metabolic encephalopathy -Thought to be possibly due to Ativan use, possibly steroid induced delirium or hypercarbia -Now off Precedex -Seems to be back to his  baseline  History of IV drug abuse -Prior history of IV fentanyl, heroin use  Hypertensive urgency -Continue Norvasc, lisinopril, HCTZ -Blood pressure remains stable  History of MAC -CTA chest from [facility] showed stable cavitary lesion in the left lung apex likely sequela of remote infection, he was diagnosed with MAC in the past, followed by [facility] pulmonology  Steroid-induced hyperglycemia -Hemoglobin A1c 7.0 -Sliding scale insulin -Likely will need oral agent on discharge  Hypomagnesemia -Replace, trend  Leukocytosis -Likely in setting of steroid use, monitor    DVT prophylaxis:  enoxaparin (LOVENOX) injection 40 mg Start: 08/09/20 1824 SCDs Start: 08/08/20 1832  Code Status: Full code Family Communication: No family at bedside; updated son over the phone Disposition Plan:  Status is: Inpatient  Remains inpatient appropriate because:Unsafe d/c plan and Inpatient level of care appropriate due to severity of illness   Dispo: The patient is from: Home              Anticipated d/c is to: Home              Patient currently is not medically stable to d/c.  Replace magnesium today, wean oxygen.  Spoke with son, patient is currently in between housing situation.  Son plans to be the one to pick him up, he will make some phone calls today to arrange for discharge tomorrow   Difficult to place patient No    Antimicrobials:  Anti-infectives (From admission, onward)   None       Objective: Vitals:   08/11/20 2037 08/12/20 0500 08/12/20 0538 08/12/20 0822  BP: 125/84  (!) 143/95  Pulse: (!) 108  92   Resp: 18  18   Temp: 98.2 F (36.8 C)  97.6 F (36.4 C)   TempSrc:      SpO2: 95%  94% 90%  Weight:  60.1 kg    Height:        Intake/Output Summary (Last 24 hours) at 08/12/2020 0912 Last data filed at 08/12/2020 0600 Gross per 24 hour  Intake 560 ml  Output 1200 ml  Net -640 ml   Filed Weights   08/09/20 0309 08/10/20 0500 08/12/20  0500  Weight: 58.7 kg 61.9 kg 60.1 kg    Examination: General exam: Appears calm and comfortable  Respiratory system: Clear to auscultation. Respiratory effort normal.  Cardiovascular system: S1 & S2 heard, RRR. No pedal edema. Gastrointestinal system: Abdomen is nondistended, soft and nontender. Normal bowel sounds heard. Central nervous system: Alert and oriented. Non focal exam.  Extremities: Symmetric in appearance bilaterally  Skin: No rashes, lesions or ulcers on exposed skin  Psychiatry: Judgement and insight appear poor. Mood & affect inappropriate.    Data Reviewed: I have personally reviewed following labs and imaging studies  CBC: Recent Labs  Lab 08/08/20 1856 08/09/20 0250 08/11/20 0514 08/12/20 0438  WBC 16.5* 12.6* 18.2* 19.3*  NEUTROABS 13.8*  --   --   --   HGB 15.8 14.1 15.8 16.0  HCT 48.7 43.4 45.3 46.1  MCV 87.3 86.8 81.3 82.2  PLT 303 264 232 409   Basic Metabolic Panel: Recent Labs  Lab 08/08/20 1856 08/09/20 0250 08/10/20 0312 08/11/20 0514 08/12/20 0438  NA 140 140 134* 134* 133*  K 4.1 4.5 4.2 3.4* 3.7  CL 97* 100 96* 95* 94*  CO2 34* 33* _0 GLUCOSE 153* 165* 315* 157* 177*  BUN 24* 27* 38* 23 21  CREATININE 0.90 0.80 1.17 0.82 0.80  CALCIUM 9.5 8.8* 8.8* 8.9 8.9  MG 2.2 2.1  --   --  1.6*  PHOS 3.2 4.9*  --   --   --    GFR: Estimated Creatinine Clearance: 74.1 mL/min (by C-G formula based on SCr of 0.8 mg/dL). Liver Function Tests: Recent Labs  Lab 08/08/20 1856 08/10/20 0312  AST 20 20  ALT 24 26  ALKPHOS 77 68  BILITOT 0.8 0.6  PROT 7.2 6.2*  ALBUMIN 3.2* 2.7*   No results for input(s): LIPASE, AMYLASE in the last 168 hours. No results for input(s): AMMONIA in the last 168 hours. Coagulation Profile: No results for input(s): INR, PROTIME in the last 168 hours. Cardiac Enzymes: No results for input(s): CKTOTAL, CKMB, CKMBINDEX, TROPONINI in the last 168 hours. BNP (last 3 results) No results for input(s): PROBNP  in the last 8760 hours. HbA1C: Recent Labs    08/10/20 0836  HGBA1C 7.0*   CBG: Recent Labs  Lab 08/11/20 1619 08/11/20 2035 08/12/20 0026 08/12/20 0447 08/12/20 0803  GLUCAP 306* 183* 168* 165* 170*   Lipid Profile: No results for input(s): CHOL, HDL, LDLCALC, TRIG, CHOLHDL, LDLDIRECT in the last 72 hours. Thyroid Function Tests: No results for input(s): TSH, T4TOTAL, FREET4, T3FREE, THYROIDAB in the last 72 hours. Anemia Panel: No results for input(s): VITAMINB12, FOLATE, FERRITIN, TIBC, IRON, RETICCTPCT in the last 72 hours. Sepsis Labs: No results for input(s): PROCALCITON, LATICACIDVEN in the last 168 hours.  Recent Results (from the past 240 hour(s))  MRSA PCR Screening     Status: None   Collection Time: 08/08/20  6:26 PM   Specimen: Nasal Mucosa;  Nasopharyngeal  Result Value Ref Range Status   MRSA by PCR NEGATIVE NEGATIVE Final    Comment:        The GeneXpert MRSA Assay (FDA approved for NASAL specimens only), is one component of a comprehensive MRSA colonization surveillance program. It is not intended to diagnose MRSA infection nor to guide or monitor treatment for MRSA infections. Performed at [facility], [place] [address] [place], [place] [address]       Radiology Studies: No results found.    Scheduled Meds: . amLODipine  10 mg Oral Daily  . arformoterol  15 mcg Nebulization BID  . budesonide (PULMICORT) nebulizer solution  0.5 mg Nebulization BID  . enoxaparin (LOVENOX) injection  40 mg Subcutaneous Daily  . famotidine  20 mg Oral BID  . hydrochlorothiazide  25 mg Oral Daily  . insulin aspart  0-20 Units Subcutaneous TID WC  . lisinopril  20 mg Oral Daily  . mouth rinse  15 mL Mouth Rinse BID  . predniSONE  40 mg Oral Q breakfast   Continuous Infusions: . magnesium sulfate bolus IVPB 2 g (08/12/20 0837)     LOS: 4 days      Time spent: 25 minutes   Dessa Phi, DO [facility] 08/12/2020, 9:12 AM    Available via Epic secure chat 7am-7pm After these hours, please refer to coverage provider listed on [email or web address]

## 2020-08-12 NOTE — Plan of Care (Signed)
  Problem: Clinical Measurements: Goal: Ability to maintain clinical measurements within normal limits will improve Outcome: Progressing Goal: Diagnostic test results will improve Outcome: Progressing   Problem: Activity: Goal: Risk for activity intolerance will decrease Outcome: Progressing   Problem: Coping: Goal: Level of anxiety will decrease Outcome: Progressing   Problem: Skin Integrity: Goal: Risk for impaired skin integrity will decrease Outcome: Progressing

## 2020-08-12 NOTE — Progress Notes (Signed)
NUTRITION NOTE  RD consulted for nutrition education regarding diabetes.   Lab Results  Component Value Date   HGBA1C 7.0 (H) 08/10/2020   Patient sitting in the chair with no family or visitors present. Patient extremely disrespectful to RD and when telling RD about his interactions with other employees.   Patient swearing excessively throughout brief interaction. He reports his sister and dad have diabetes and "I'm not stupid. I know what to do" in reference to managing DM.   Education is not appropriate at this time. Patient reports that he was not eating well PTA d/t substance use/abuse.   He is currently on a Carb Modified diet and has been eating 100% at all meals.   Expect poor compliance.  Body mass index is 17.97 kg/m. Pt meets criteria for underweight based on current BMI.  Labs and medications reviewed. No further nutrition interventions warranted at this time. RD contact information provided. If additional nutrition issues arise, please re-consult RD.      Jarome Matin, MS, RD, LDN, CNSC Inpatient Clinical Dietitian RD pager # available in [place]  After hours/weekend pager # available in [facility]

## 2020-08-13 LAB — CBC
HCT: 45.5 % (ref 39.0–52.0)
Hemoglobin: 15.6 g/dL (ref 13.0–17.0)
MCH: 28.5 pg (ref 26.0–34.0)
MCHC: 34.3 g/dL (ref 30.0–36.0)
MCV: 83 fL (ref 80.0–100.0)
Platelets: 218 10*3/uL (ref 150–400)
RBC: 5.48 MIL/uL (ref 4.22–5.81)
RDW: 13.2 % (ref 11.5–15.5)
WBC: 20.3 10*3/uL — ABNORMAL HIGH (ref 4.0–10.5)
nRBC: 0 % (ref 0.0–0.2)

## 2020-08-13 LAB — GLUCOSE, CAPILLARY
Glucose-Capillary: 146 mg/dL — ABNORMAL HIGH (ref 70–99)
Glucose-Capillary: 168 mg/dL — ABNORMAL HIGH (ref 70–99)
Glucose-Capillary: 180 mg/dL — ABNORMAL HIGH (ref 70–99)
Glucose-Capillary: 212 mg/dL — ABNORMAL HIGH (ref 70–99)

## 2020-08-13 LAB — BASIC METABOLIC PANEL
Anion gap: 9 (ref 5–15)
BUN: 22 mg/dL (ref 8–23)
CO2: 29 mmol/L (ref 22–32)
Calcium: 8.7 mg/dL — ABNORMAL LOW (ref 8.9–10.3)
Chloride: 97 mmol/L — ABNORMAL LOW (ref 98–111)
Creatinine, Ser: 0.78 mg/dL (ref 0.61–1.24)
GFR, Estimated: >60 mL/min (ref >60)
Glucose, Bld: 220 mg/dL — ABNORMAL HIGH (ref 70–99)
Potassium: 3.6 mmol/L (ref 3.5–5.1)
Sodium: 135 mmol/L (ref 135–145)

## 2020-08-13 LAB — MAGNESIUM: Magnesium: 1.8 mg/dL (ref 1.7–2.4)

## 2020-08-13 MED ORDER — METFORMIN HCL 500 MG PO TABS: 1000.0000 mg | ORAL_TABLET | Freq: Every day | ORAL | 2 refills | Status: DC

## 2020-08-13 MED ORDER — FLUTICASONE FUROATE-VILANTEROL 200-25 MCG/INH IN AEPB: 1.0000 | INHALATION_SPRAY | Freq: Every day | RESPIRATORY_TRACT | 2 refills | Status: DC

## 2020-08-13 MED ORDER — HYDROCHLOROTHIAZIDE 25 MG PO TABS: 25.0000 mg | ORAL_TABLET | Freq: Every day | ORAL | 2 refills | Status: AC

## 2020-08-13 MED ORDER — AMLODIPINE BESYLATE 10 MG PO TABS: 10.0000 mg | ORAL_TABLET | Freq: Every day | ORAL | 2 refills | Status: AC

## 2020-08-13 MED ORDER — ALBUTEROL SULFATE HFA 108 (90 BASE) MCG/ACT IN AERS: 2.0000 | INHALATION_SPRAY | Freq: Four times a day (QID) | RESPIRATORY_TRACT | 2 refills | Status: AC | PRN

## 2020-08-13 MED ORDER — PREDNISONE 10 MG PO TABS: ORAL_TABLET | ORAL | 0 refills | Status: DC

## 2020-08-13 MED ORDER — BLOOD GLUCOSE METER KIT: PACK | 0 refills | Status: AC

## 2020-08-13 MED ORDER — LISINOPRIL 20 MG PO TABS: 20.0000 mg | ORAL_TABLET | Freq: Every day | ORAL | 2 refills | Status: DC

## 2020-08-13 NOTE — Discharge Summary (Signed)
Physician Discharge Summary  [name] FOY:[id number] DOB: [birth year] DOA: 08/08/2020  PCP: [name], MD  Admit date: 08/08/2020 Discharge date: 08/13/2020  Admitted From: Home Disposition:  Home  Recommendations for Outpatient Follow-up:  1. Follow up with PCP in 1 week 2. Check CBC once off prednisone to check leukocytosis   Discharge Condition: Stable CODE STATUS: Full  Diet recommendation: Carb modified   Brief/Interim Summary: Eric Leach is a 70 year old male who transferred from [facility] on 4/16.  He was originally admitted at [facility] on 4/13 with dyspnea, diagnosed with COPD exacerbation secondary to rhinovirus.  After several days, he was transferred to ICU, placed on BiPAP, eventually transferred to [facility] long hospital under ICU team.  [facility] events as below:  4/13 admitted to [facility]  4/13 SARS COV2 negative, positive for rhinovirus  4/14 CT angiogram > negative for PE, notable for LUL cavity, prior surgery LUL, emphysema noted  4/14 CT head >atrophy, no acute changes   4/16 transfer to [facility] for confusion, hypercapnea, need for BIPAP  4/17 off BiPAP  4/19 transferred to [facility] service  Discharge Diagnoses:  Principal Problem:   Acute respiratory failure with hypoxemia ([place]) Active Problems:   Essential hypertension   Acute metabolic encephalopathy   History of drug abuse ([place])   Hypertensive urgency   Hyperglycemia   Type 2 diabetes mellitus without complication (HCC)   Hypomagnesemia   Hypokalemia   Leukocytosis   Acute hypoxemic/hypercarbic respiratory failure -Was on BiPAP on admission  COPD exacerbation secondary to rhinovirus -Transitioned to prednisone, continue Brovana/Pulmicort, DuoNeb -Appears that patient was treated with Rocephin, azithromycin prior to transfer to [facility] long hospital -Prednisone taper on discharge   Acute metabolic encephalopathy -Thought to be possibly due to Ativan  use, possibly steroid induced delirium or hypercarbia -Now off Precedex -Now back to his baseline  History of IV drug abuse -Prior history of IV fentanyl, heroin use  Hypertensive urgency -Continue Norvasc, lisinopril, HCTZ -Blood pressure remains stable   History of MAC -CTA chest from [facility] showed stable cavitary lesion in the left lung apex likely sequela of remote infection, he was diagnosed with MAC in the past, followed by [facility] pulmonology  Steroid-induced hyperglycemia -Hemoglobin A1c 7.0 -Sliding scale insulin -Metformin on discharge   Leukocytosis -Likely in setting of steroid use, monitor   Discharge Instructions  Discharge Instructions    Call MD for:  difficulty breathing, headache or visual disturbances   Complete by: As directed    Call MD for:  extreme fatigue   Complete by: As directed    Call MD for:  persistant dizziness or light-headedness   Complete by: As directed    Call MD for:  persistant nausea and vomiting   Complete by: As directed    Call MD for:  severe uncontrolled pain   Complete by: As directed    Call MD for:  temperature >100.4   Complete by: As directed    Discharge instructions   Complete by: As directed    You were cared for by a hospitalist during your hospital stay. If you have any questions about your discharge medications or the care you received while you were in the hospital after you are discharged, you can call the unit and ask to speak with the hospitalist on call if the hospitalist that took care of you is not available. Once you are discharged, your primary care physician will handle any further medical issues. Please note  that NO REFILLS for any discharge medications will be authorized once you are discharged, as it is imperative that you return to your primary care physician (or establish a relationship with a primary care physician if you do not have one) for your aftercare needs so that they can  reassess your need for medications and monitor your lab values.   Increase activity slowly   Complete by: As directed      Allergies as of 08/13/2020      Reactions   Sulfa Antibiotics    Other reaction(s): Unknown      Medication List    STOP taking these medications   lisinopril-hydrochlorothiazide 10-12.5 MG tablet Commonly known as: ZESTORETIC   Nebulizer Misc   sildenafil 100 MG tablet Commonly known as: Viagra   tadalafil 20 MG tablet Commonly known as: CIALIS     TAKE these medications   albuterol 108 (90 Base) MCG/ACT inhaler Commonly known as: VENTOLIN HFA Inhale 2 puffs into the lungs every 6 (six) hours as needed for wheezing or shortness of breath. What changed: Another medication with the same name was removed. Continue taking this medication, and follow the directions you see here.   amLODipine 10 MG tablet Commonly known as: NORVASC Take 1 tablet (10 mg total) by mouth daily.   blood glucose meter kit and supplies Dispense based on patient and insurance preference. Use up to four times daily as directed. (FOR ICD-10 E10.9, E11.9).   fluticasone furoate-vilanterol 200-25 MCG/INH Aepb Commonly known as: BREO ELLIPTA Inhale 1 puff into the lungs daily.   hydrochlorothiazide 25 MG tablet Commonly known as: HYDRODIURIL Take 1 tablet (25 mg total) by mouth daily.   lisinopril 20 MG tablet Commonly known as: ZESTRIL Take 1 tablet (20 mg total) by mouth daily.   metFORMIN 500 MG tablet Commonly known as: Glucophage Take 2 tablets (1,000 mg total) by mouth daily with breakfast.   predniSONE 10 MG tablet Commonly known as: DELTASONE Take 4 tabs for 3 days, then 3 tabs for 3 days, then 2 tabs for 3 days, then 1 tab for 3 days, then 1/2 tab for 4 days.       Allergies  Allergen Reactions  . Sulfa Antibiotics     Other reaction(s): Unknown      Procedures/Studies: DG Chest Port 1 View  Result Date: 08/08/2020 CLINICAL DATA:  Respiratory  failure with hypoxemia EXAM: PORTABLE CHEST 1 VIEW COMPARISON:  08/08/2020 FINDINGS: Cardiac shadow is stable. Aortic calcifications are seen. Chronic scarring in the left apex is seen with postsurgical changes. No focal infiltrate or sizable effusion is seen. Previously noted changes in the right lung are no longer identified. No bony abnormality is seen. IMPRESSION: Chronic scarring on the left. No acute abnormality noted. Electronically Signed   By: Inez Catalina M.D.   On: 08/08/2020 19:15       Discharge Exam: Vitals:   08/13/20 0448 08/13/20 0800  BP: 134/81   Pulse: (!) 107   Resp:    Temp: 98.4 F (36.9 C)   SpO2: 95% 97%    General: Pt is alert, awake, not in acute distress Cardiovascular: RRR, S1/S2 +, no edema Respiratory: CTA bilaterally, no wheezing, no rhonchi, no respiratory distress, no conversational dyspnea  Abdominal: Soft, NT, ND, bowel sounds + Extremities: no edema, no cyanosis Psych: Normal mood and affect   The results of significant diagnostics from this hospitalization (including imaging, microbiology, ancillary and laboratory) are listed below for reference.  Microbiology: Recent Results (from the past 240 hour(s))  MRSA PCR Screening     Status: None   Collection Time: 08/08/20  6:26 PM   Specimen: Nasal Mucosa; Nasopharyngeal  Result Value Ref Range Status   MRSA by PCR NEGATIVE NEGATIVE Final    Comment:        The GeneXpert MRSA Assay (FDA approved for NASAL specimens only), is one component of a comprehensive MRSA colonization surveillance program. It is not intended to diagnose MRSA infection nor to guide or monitor treatment for MRSA infections. Performed at [facility], [place] [address], [place], [place] [address]      Labs: BNP (last 3 results) No results for input(s): BNP in the last 8760 hours. Basic Metabolic Panel: Recent Labs  Lab 08/08/20 1856 08/09/20 0250 08/10/20 0312 08/11/20 0514 08/12/20 0438  08/13/20 0453  NA 140 140 134* 134* 133* 135  K 4.1 4.5 4.2 3.4* 3.7 3.6  CL 97* 100 96* 95* 94* 97*  CO2 34* 33* _0 GLUCOSE 153* 165* 315* 157* 177* 220*  BUN 24* 27* 38* _1 CREATININE 0.90 0.80 1.17 0.82 0.80 0.78  CALCIUM 9.5 8.8* 8.8* 8.9 8.9 8.7*  MG 2.2 2.1  --   --  1.6* 1.8  PHOS 3.2 4.9*  --   --   --   --    Liver Function Tests: Recent Labs  Lab 08/08/20 1856 08/10/20 0312  AST 20 20  ALT 24 26  ALKPHOS 77 68  BILITOT 0.8 0.6  PROT 7.2 6.2*  ALBUMIN 3.2* 2.7*   No results for input(s): LIPASE, AMYLASE in the last 168 hours. No results for input(s): AMMONIA in the last 168 hours. CBC: Recent Labs  Lab 08/08/20 1856 08/09/20 0250 08/11/20 0514 08/12/20 0438 08/13/20 0453  WBC 16.5* 12.6* 18.2* 19.3* 20.3*  NEUTROABS 13.8*  --   --   --   --   HGB 15.8 14.1 15.8 16.0 15.6  HCT 48.7 43.4 45.3 46.1 45.5  MCV 87.3 86.8 81.3 82.2 83.0  PLT 303 264 232 213 218   Cardiac Enzymes: No results for input(s): CKTOTAL, CKMB, CKMBINDEX, TROPONINI in the last 168 hours. BNP: Invalid input(s): POCBNP CBG: Recent Labs  Lab 08/12/20 1629 08/12/20 2016 08/13/20 0010 08/13/20 0446 08/13/20 0753  GLUCAP 270* 162* 180* 212* 168*   D-Dimer No results for input(s): DDIMER in the last 72 hours. Hgb A1c No results for input(s): HGBA1C in the last 72 hours. Lipid Profile No results for input(s): CHOL, HDL, LDLCALC, TRIG, CHOLHDL, LDLDIRECT in the last 72 hours. Thyroid function studies No results for input(s): TSH, T4TOTAL, T3FREE, THYROIDAB in the last 72 hours.  Invalid input(s): FREET3 Anemia work up No results for input(s): VITAMINB12, FOLATE, FERRITIN, TIBC, IRON, RETICCTPCT in the last 72 hours. Urinalysis    Component Value Date/Time   COLORURINE YELLOW 02/09/2011 2104   APPEARANCEUR CLEAR 02/09/2011 2104   LABSPEC >1.030 (H) 02/09/2011 2104   PHURINE 5.5 02/09/2011 2104   GLUCOSEU NEGATIVE 02/09/2011 2104   HGBUR NEGATIVE 02/09/2011  2104   BILIRUBINUR NEGATIVE 02/09/2011 2104   [place] NEGATIVE 02/09/2011 2104   PROTEINUR NEGATIVE 02/09/2011 2104   UROBILINOGEN 0.2 02/09/2011 2104   NITRITE NEGATIVE 02/09/2011 2104   LEUKOCYTESUR NEGATIVE 02/09/2011 2104   Sepsis Labs Invalid input(s): PROCALCITONIN,  WBC,  LACTICIDVEN Microbiology Recent Results (from the past 240 hour(s))  MRSA PCR Screening     Status: None   Collection  Time: 08/08/20  6:26 PM   Specimen: Nasal Mucosa; Nasopharyngeal  Result Value Ref Range Status   MRSA by PCR NEGATIVE NEGATIVE Final    Comment:        The GeneXpert MRSA Assay (FDA approved for NASAL specimens only), is one component of a comprehensive MRSA colonization surveillance program. It is not intended to diagnose MRSA infection nor to guide or monitor treatment for MRSA infections. Performed at [facility], [address] [place], [place] [address]      Patient was seen and examined on the day of discharge and was found to be in stable condition. Time coordinating discharge: 25 minutes including assessment and coordination of care, as well as examination of the patient.   SIGNED:   , DO [facility] 08/13/2020, 9:26 AM    

## 2020-08-13 NOTE — Progress Notes (Signed)
Physical Therapy Treatment Patient Details Name: Eric Leach MRN: [id number] DOB: [birth year] Today's Date: 08/13/2020    History of Present Illness Eric Leach is a 71 year old male who transferred from [facility] on 4/16.  He was originally admitted at [facility] on 4/13 with dyspnea, diagnosed with COPD exacerbation secondary to rhinovirus.  After several days, he was transferred to ICU, placed on BiPAP, eventually transferred to [facility] long hospital. PMH: HTN, COPD    PT Comments    Session limited to seated exercises. Pt performs 1 STS rep, then states "too much" for additional reps or additional exercises in standing. Pt motivated on his own terms, willing to participate if therapist brings graham crackers. Pt declines ambulation, again stating "too much" and then states he is going home today. Offered additional exercises and gait training, but pt declines. Will continue to attempt acute PT interventions to improve gait, strength and balance pending pt willingness.   Follow Up Recommendations  Supervision for mobility/OOB     Equipment Recommendations  None recommended by PT    Recommendations for Other Services       Precautions / Restrictions Precautions Precautions: Fall Restrictions Weight Bearing Restrictions: No    Mobility  Bed Mobility  General bed mobility comments: in chair upon arrival    Transfers Overall transfer level: Needs assistance Equipment used: None Transfers: Sit to/from Stand Sit to Stand: Supervision  General transfer comment: powers up to stand without UE assisting, good steadiness upon rising, returns to sitting after ~5 seconds and declines additional standing  Ambulation/Gait  General Gait Details: pt declines, "too much"   Stairs             Wheelchair Mobility    Modified Rankin (Stroke Patients Only)       Balance Overall balance assessment: No apparent balance deficits (not formally assessed)  (limited to standing ~5 seconds)       Cognition Arousal/Alertness: Awake/alert Behavior During Therapy: WFL for tasks assessed/performed (see general comments) Overall Cognitive Status: No family/caregiver present to determine baseline cognitive functioning    General Comments: Pt's varies between converastionally friendly and agitated. Pt agreeable to therapy, but motivated on his own terms. Pt curses at times. 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      Exercises General Exercises - Lower Extremity Long Arc Quad: Seated;AROM;Strengthening;Both;10 reps Hip Flexion/Marching: Seated;AROM;Strengthening;Both;10 reps Other Exercises Other Exercises: STS, attempted 10 reps but pt only performs 1 rep, states "too much" for additional reps, no SOB noted    General Comments        Pertinent Vitals/Pain Pain Assessment: No/denies pain    Home Living                      Prior Function            PT Goals (current goals can now be found in the care plan section) Acute Rehab PT Goals Patient Stated Goal: Did not state PT Goal Formulation: With patient Time For Goal Achievement: 08/25/20 Potential to Achieve Goals: Good Progress towards PT goals: Progressing toward goals    Frequency    Min 3X/week      PT Plan Current plan remains appropriate    Co-evaluation              AM-PAC PT "6 Clicks" Mobility   Outcome Measure  Help needed turning from your back to  your side while in a flat bed without using bedrails?: None Help needed moving from lying on your back to sitting on the side of a flat bed without using bedrails?: None Help needed moving to and from a bed to a chair (including a wheelchair)?: A Little Help needed standing up from a chair using your arms (e.g., wheelchair or bedside chair)?: A Little Help needed to walk in hospital room?: A Little Help needed climbing 3-5  steps with a railing? : A Little 6 Click Score: 20    End of Session   Activity Tolerance: Patient tolerated treatment well Patient left: in chair;with chair alarm set;with call bell/phone within reach Nurse Communication: Mobility status PT Visit Diagnosis: Unsteadiness on feet (R26.81)     Time: 1364-3837 PT Time Calculation (min) (ACUTE ONLY): 11 min  Charges:  $Therapeutic Exercise: 8-22 mins                     Tori Loys Hoselton PT, DPT 08/13/20, 1:00 PM

## 2020-08-13 NOTE — Progress Notes (Signed)
Went over discharge papers with patient.  All questions answered.  Glucose monitor teaching done.  Patient has no further questions.  IV's removed.  Pt wheeled out to waiting family member (son).

## 2020-08-13 NOTE — Plan of Care (Signed)
  Problem: Clinical Measurements: Goal: Respiratory complications will improve Outcome: Progressing   Problem: Activity: Goal: Risk for activity intolerance will decrease Outcome: Progressing   Problem: Nutrition: Goal: Adequate nutrition will be maintained Outcome: Progressing   Problem: Coping: Goal: Level of anxiety will decrease Outcome: Progressing   Problem: Elimination: Goal: Will not experience complications related to bowel motility Outcome: Progressing Goal: Will not experience complications related to urinary retention Outcome: Progressing   Problem: Pain Managment: Goal: General experience of comfort will improve Outcome: Progressing   Problem: Safety: Goal: Ability to remain free from injury will improve Outcome: Progressing   Problem: Skin Integrity: Goal: Risk for impaired skin integrity will decrease Outcome: Progressing   Problem: Education: Goal: Knowledge of disease or condition will improve Outcome: Progressing Goal: Knowledge of the prescribed therapeutic regimen will improve Outcome: Progressing Goal: Individualized Educational Video(s) Outcome: Progressing   Problem: Activity: Goal: Ability to tolerate increased activity will improve Outcome: Progressing Goal: Will verbalize the importance of balancing activity with adequate rest periods Outcome: Progressing   Problem: Respiratory: Goal: Ability to maintain a clear airway will improve Outcome: Progressing Goal: Levels of oxygenation will improve Outcome: Progressing Goal: Ability to maintain adequate ventilation will improve Outcome: Progressing

## 2020-08-13 NOTE — Progress Notes (Signed)
SATURATION QUALIFICATIONS: (This note is used to comply with regulatory documentation for home oxygen)  Patient Saturations on Room Air at Rest =97%  Patient Saturations on Room Air while Ambulating = 95%  Patient Saturations on 0 Liters of oxygen while Ambulating = 95%  Please briefly explain why patient needs home oxygen: Pt does not qualify for home o2.

## 2020-08-13 NOTE — TOC Transition Note (Signed)
Transition of Care [facility]) - CM/SW Discharge Note   Patient Details  Name: Eric Leach MRN: [id number] Date of Birth: [birth year]  Transition of Care [facility]) CM/SW Contact:  Dessa Phi, RN Phone Number: 08/13/2020, 1:01 PM   Clinical Narrative: d/c home today. Provided w/pcp list for [facility]. No further CM needs.        Barriers to Discharge: No Barriers Identified   Patient Goals and CMS Choice Patient states their goals for this hospitalization and ongoing recovery are:: not stated   Choice offered to / list presented to : Patient  Discharge Placement                       Discharge Plan and Services   Discharge Planning Services: CM Consult                                 Social Determinants of Health (SDOH) Interventions     Readmission Risk Interventions No flowsheet data found.

## 2020-08-14 ENCOUNTER — Telehealth: Payer: Self-pay

## 2020-08-14 NOTE — Telephone Encounter (Signed)
Transition Care Management Follow-up Telephone Call  Date of discharge and from where: 08/13/20 from [facility]  How have you been since you were released from the hospital? Stable, not feeling great, very weak, not getting around on his own very well  Any questions or concerns? No  Items Reviewed:  Did the pt receive and understand the discharge instructions provided? Yes   Medications obtained and verified? Yes   Other? No  He hasn't been able to get Glucometer yet - He has to call his insurance  Any new allergies since your discharge? No   Dietary orders reviewed? Yes  Do you have support at home? Yes  He has help for now because his brother came in town to help out, but he doesn't know how long he can stay.  Home Care and Equipment/Supplies: Were home health services ordered? no  Were any new equipment or medical supplies ordered?  Yes: glucometer What is the name of the medical supply agency? Attempting to get from [facility] in [place] Were you able to get the supplies/equipment? No - he is going to [employer] and will let us know if he still can't get  Functional Questionnaire: (I = Independent and D = Dependent) ADLs: D - weak and SOB, his brother is helping a great deal right now - hopes to get strong enough to manage on his own in the next few days  Bathing/Dressing- I  Meal Prep- D  Eating- I  Maintaining continence- I  Transferring/Ambulation- D  Managing Meds- I  Follow up appointments reviewed:   PCP Hospital f/u appt confirmed? Yes  Scheduled to see Dr Livia Snellen on 08/21/20 @ 10:10.  [facility] f/u appt confirmed? No    Are transportation arrangements needed? unsure at this time - will call if he needs assistance  If their condition worsens, is the pt aware to call PCP or go to the Emergency Dept.? Yes  Was the patient provided with contact information for the PCP's office or ED? Yes  Was to pt encouraged to call back with  questions or concerns? Yes

## 2020-08-21 ENCOUNTER — Telehealth: Payer: Self-pay | Admitting: *Deleted

## 2020-08-21 ENCOUNTER — Encounter: Payer: Self-pay | Admitting: Family Medicine

## 2020-08-21 ENCOUNTER — Ambulatory Visit (INDEPENDENT_AMBULATORY_CARE_PROVIDER_SITE_OTHER): Payer: Medicare Other | Admitting: Family Medicine

## 2020-08-21 ENCOUNTER — Other Ambulatory Visit: Payer: Self-pay

## 2020-08-21 VITALS — BP 128/83 | HR 86 | Temp 97.8°F | Resp 20 | Ht 72.0 in | Wt 138.0 lb

## 2020-08-21 DIAGNOSIS — I10 Essential (primary) hypertension: Secondary | ICD-10-CM | POA: Diagnosis not present

## 2020-08-21 DIAGNOSIS — R350 Frequency of micturition: Secondary | ICD-10-CM

## 2020-08-21 DIAGNOSIS — E119 Type 2 diabetes mellitus without complications: Secondary | ICD-10-CM

## 2020-08-21 DIAGNOSIS — J418 Mixed simple and mucopurulent chronic bronchitis: Secondary | ICD-10-CM | POA: Diagnosis not present

## 2020-08-21 DIAGNOSIS — A310 Pulmonary mycobacterial infection: Secondary | ICD-10-CM

## 2020-08-21 MED ORDER — IPRATROPIUM-ALBUTEROL 0.5-2.5 (3) MG/3ML IN SOLN: 3.0000 mL | RESPIRATORY_TRACT | 5 refills | Status: AC | PRN

## 2020-08-21 MED ORDER — BREZTRI AEROSPHERE 160-9-4.8 MCG/ACT IN AERO: 2.0000 | INHALATION_SPRAY | Freq: Two times a day (BID) | RESPIRATORY_TRACT | 11 refills | Status: AC

## 2020-08-21 MED ORDER — FREESTYLE LIBRE SENSOR SYSTEM MISC: 26 refills | Status: AC

## 2020-08-21 NOTE — Chronic Care Management (AMB) (Signed)
  Chronic Care Management   Outreach Note  08/21/2020 Name: Eric Leach MRN: [id number] DOB: [birth year]  Eric Leach is a 70 y.o. year old male who is a primary care patient of Stacks, Cletus Gash, MD. I reached out to Eric Leach by phone today in response to a emergent referral for [facility] and SW sent by Eric Leach's PCP, Dr. Livia Snellen.     An unsuccessful telephone outreach was attempted today. The patient was referred to the case management team for assistance with care management and care coordination.   Follow Up Plan: A HIPAA compliant phone message was left for the patient providing contact information and requesting a return call. The care management team will reach out to the patient again over the next 3 days. If patient returns call to provider office, please advise to call [place] at [phone].  [facility]

## 2020-08-21 NOTE — Progress Notes (Signed)
Subjective:  Patient ID: Eric Leach, male    DOB: [birth year]  Age: 70 y.o. MRN: [id number]  CC: Transitions Of Care [facility] follow up - [place] discharge 4/21 - dx acute resp failure )   HPI Eric Leach presents for hospitalization follow up. Hospitalized from  4/16 to 08/13/20 for COPD exacerbation and respiratory failure. Son says he is also having cognition/memory issues. He was diagnosed with diabetes as well.  Son helping with diabetes management. Sister  is helping with his meal  prep Both are concerned about his cognition.   He is walking normally. He is not currently short of breath, but he does not have an inhaler. He has some dyspnea on exertion.        Depression screen [facility] 2/9 08/21/2020 09/19/2018  Decreased Interest 0 0  Down, Depressed, Hopeless 0 0  PHQ - 2 Score 0 0    History Delynn has a past medical history of Acute metabolic encephalopathy (1/75/1025), Acute respiratory failure with hypoxemia ([place]) (08/08/2020), COPD (chronic obstructive pulmonary disease) ([place]), Hypertension, and Lung cancer ([place]).   He has a past surgical history that includes Lung surgery and Knee arthroscopy.   His family history includes Cancer in his father and mother; Diabetes in his father; Hypertension in his mother.He reports that he has been smoking cigarettes. He has never used smokeless tobacco. He reports current alcohol use. He reports that he does not use drugs.    ROS Review of Systems  Constitutional: Negative for fever.  Respiratory: Positive for cough and shortness of breath.   Cardiovascular: Negative for chest pain.  Musculoskeletal: Negative for arthralgias.  Skin: Negative for rash.  All other systems reviewed and are negative.   Objective:  BP 128/83   Pulse 86   Temp 97.8 F (36.6 C)   Resp 20   Ht 6' (1.829 m)   Wt 138 lb (62.6 kg)   SpO2 96%   BMI 18.72 kg/m   BP Readings from Last 3 Encounters:  08/21/20 128/83  08/13/20 111/72   10/29/18 (!) 159/99    Wt Readings from Last 3 Encounters:  08/21/20 138 lb (62.6 kg)  08/13/20 131 lb 9.8 oz (59.7 kg)  10/29/18 147 lb (66.7 kg)     Physical Exam Vitals reviewed.  Constitutional:      Appearance: He is well-developed.  HENT:     Head: Normocephalic and atraumatic.     Right Ear: Tympanic membrane and external ear normal. No decreased hearing noted.     Left Ear: Tympanic membrane and external ear normal. No decreased hearing noted.     Mouth/Throat:     Pharynx: No oropharyngeal exudate or posterior oropharyngeal erythema.  Eyes:     Pupils: Pupils are equal, round, and reactive to light.  Cardiovascular:     Rate and Rhythm: Normal rate and regular rhythm.     Heart sounds: No murmur heard.   Pulmonary:     Comments: Breath sounds are distant no rales or rhonchi  Abdominal:     General: Bowel sounds are normal.     Palpations: Abdomen is soft. There is no mass.     Tenderness: There is no abdominal tenderness.  Musculoskeletal:     Cervical back: Normal range of motion and neck supple.       Assessment & Plan:   Tuan was seen today for transitions of care.  Diagnoses and all orders for this visit:  Essential hypertension -  CBC with Differential/Platelet -     CMP14+EGFR -     AMB Referral to [place]  Mixed simple and mucopurulent chronic bronchitis ([place]) -     For home use only DME Nebulizer machine -     CBC with Differential/Platelet -     CMP14+EGFR -     AMB Referral to Community Care Coordinaton  Type 2 diabetes mellitus without complication, without long-term current use of insulin (HCC) -     CBC with Differential/Platelet -     CMP14+EGFR -     AMB Referral to Community Care Coordinaton  Hypomagnesemia  Mycobacterium avium complex ([place])  Frequency of micturition -     Urinalysis -     Urine Culture  Other orders -     Continuous Blood Gluc Sensor ([place]) MISC;  Measure glucose before meals and at bedtime -     Budeson-Glycopyrrol-Formoterol (BREZTRI AEROSPHERE) 160-9-4.8 MCG/ACT AERO; Inhale 2 puffs into the lungs in the morning and at bedtime. -     ipratropium-albuterol (DUONEB) 0.5-2.5 (3) MG/3ML SOLN; Take 3 mLs by nebulization every 4 (four) hours as needed (breathing).       I have discontinued Eric Leach's predniSONE. I am also having him start on [employer], and ipratropium-albuterol. Additionally, I am having him maintain his amLODipine, hydrochlorothiazide, lisinopril, metFORMIN, blood glucose meter kit and supplies, albuterol, and fluticasone furoate-vilanterol.  Allergies as of 08/21/2020      Reactions   Sulfa Antibiotics    Other reaction(s): Unknown      Medication List       Accurate as of August 21, 2020 11:59 PM. If you have any questions, ask your nurse or doctor.        STOP taking these medications   predniSONE 10 MG tablet Commonly known as: DELTASONE Stopped by: [name], MD     TAKE these medications   albuterol 108 (90 Base) MCG/ACT inhaler Commonly known as: VENTOLIN HFA Inhale 2 puffs into the lungs every 6 (six) hours as needed for wheezing or shortness of breath.   amLODipine 10 MG tablet Commonly known as: NORVASC Take 1 tablet (10 mg total) by mouth daily.   blood glucose meter kit and supplies Dispense based on patient and insurance preference. Use up to four times daily as directed. (FOR ICD-10 E10.9, E11.9).   Breztri Aerosphere 160-9-4.8 MCG/ACT Aero Generic drug: Budeson-Glycopyrrol-Formoterol Inhale 2 puffs into the lungs in the morning and at bedtime. Started by: [name], MD   fluticasone furoate-vilanterol 200-25 MCG/INH Aepb Commonly known as: BREO ELLIPTA Inhale 1 puff into the lungs daily.   FreeStyle [employer] Misc Measure glucose before meals and at bedtime Started by: [name], MD   hydrochlorothiazide  25 MG tablet Commonly known as: HYDRODIURIL Take 1 tablet (25 mg total) by mouth daily.   ipratropium-albuterol 0.5-2.5 (3) MG/3ML Soln Commonly known as: DUONEB Take 3 mLs by nebulization every 4 (four) hours as needed (breathing). Started by: [name], MD   lisinopril 20 MG tablet Commonly known as: ZESTRIL Take 1 tablet (20 mg total) by mouth daily.   metFORMIN 500 MG tablet Commonly known as: Glucophage Take 2 tablets (1,000 mg total) by mouth daily with breakfast.            Durable Medical Equipment  (From admission, onward)         Start     Ordered   08/21/20 0000  For home use only DME Nebulizer machine       Question Answer Comment  Patient needs a nebulizer to treat with the following condition COPD (chronic obstructive pulmonary disease) ([place])   Length of Need Lifetime      08/21/20 1115           Follow-up: No follow-ups on file.  [name], M.D.

## 2020-08-22 ENCOUNTER — Encounter: Payer: Self-pay | Admitting: Family Medicine

## 2020-08-22 LAB — CBC WITH DIFFERENTIAL/PLATELET
Basophils Absolute: 0.1 10*3/uL (ref 0.0–0.2)
Basos: 1 %
EOS (ABSOLUTE): 0.1 10*3/uL (ref 0.0–0.4)
Eos: 1 %
Hematocrit: 43.9 % (ref 37.5–51.0)
Hemoglobin: 14.5 g/dL (ref 13.0–17.7)
Immature Grans (Abs): 0.1 10*3/uL (ref 0.0–0.1)
Immature Granulocytes: 1 %
Lymphocytes Absolute: 1.9 10*3/uL (ref 0.7–3.1)
Lymphs: 17 %
MCH: 28.2 pg (ref 26.6–33.0)
MCHC: 33 g/dL (ref 31.5–35.7)
MCV: 85 fL (ref 79–97)
Monocytes Absolute: 1 10*3/uL — ABNORMAL HIGH (ref 0.1–0.9)
Monocytes: 9 %
Neutrophils Absolute: 8 10*3/uL — ABNORMAL HIGH (ref 1.4–7.0)
Neutrophils: 71 %
Platelets: 335 10*3/uL (ref 150–450)
RBC: 5.15 x10E6/uL (ref 4.14–5.80)
RDW: 14.9 % (ref 11.6–15.4)
WBC: 11.1 10*3/uL — ABNORMAL HIGH (ref 3.4–10.8)

## 2020-08-22 LAB — CMP14+EGFR
ALT: 28 IU/L (ref 0–44)
AST: 17 IU/L (ref 0–40)
Albumin/Globulin Ratio: 1.1 — ABNORMAL LOW (ref 1.2–2.2)
Albumin: 3.5 g/dL — ABNORMAL LOW (ref 3.8–4.8)
Alkaline Phosphatase: 97 IU/L (ref 44–121)
BUN/Creatinine Ratio: 16 (ref 10–24)
BUN: 14 mg/dL (ref 8–27)
Bilirubin Total: 0.3 mg/dL (ref 0.0–1.2)
CO2: 26 mmol/L (ref 20–29)
Calcium: 9.2 mg/dL (ref 8.6–10.2)
Chloride: 98 mmol/L (ref 96–106)
Creatinine, Ser: 0.86 mg/dL (ref 0.76–1.27)
Globulin, Total: 3.2 g/dL (ref 1.5–4.5)
Glucose: 106 mg/dL — ABNORMAL HIGH (ref 65–99)
Potassium: 4.8 mmol/L (ref 3.5–5.2)
Sodium: 140 mmol/L (ref 134–144)
Total Protein: 6.7 g/dL (ref 6.0–8.5)
eGFR: 94 mL/min/{1.73_m2} (ref >59)

## 2020-08-24 ENCOUNTER — Telehealth: Payer: Self-pay | Admitting: *Deleted

## 2020-08-24 NOTE — Chronic Care Management (AMB) (Signed)
  Chronic Care Management   Note  08/24/2020 Name: Eric Leach MRN: [id number] DOB: 28-May-1950  Eric Leach is a 70 y.o. year old male who is a primary care patient of Stacks, Cletus Gash, MD. I reached out to Eric Leach by phone today in response to a referral sent by Eric Leach's PCP, Dr. Livia Snellen.     Eric Leach was given information about Chronic Care Management services today including:  1. CCM service includes personalized support from designated clinical staff supervised by his physician, including individualized plan of care and coordination with other care providers 2. 24/7 contact phone numbers for assistance for urgent and routine care needs. 3. Service will only be billed when office clinical staff spend 20 minutes or more in a month to coordinate care. 4. Only one practitioner may furnish and bill the service in a calendar month. 5. The patient may stop CCM services at any time (effective at the end of the month) by phone call to the office staff. 6. The patient will be responsible for cost sharing (co-pay) of up to 20% of the service fee (after annual deductible is met).  Patient agreed to services and verbal consent obtained.   Follow up plan: Telephone appointment with care management team member scheduled for:  Licensed Clinical Social Worker 08/25/2020 RNCM on 09/04/2020  [place] Management

## 2020-08-24 NOTE — Chronic Care Management (AMB) (Signed)
  Chronic Care Management   Outreach Note  08/24/2020 Name: Eric Leach MRN: [id number] DOB: [birth year]  Eric Leach is a 70 y.o. year old male who is a primary care patient of Stacks, Cletus Gash, MD. I reached out to Eric Leach by phone today in response to a referral sent by Eric Leach PCP, [name], MD.     A second unsuccessful telephone outreach was attempted today. The patient was referred to the case management team for assistance with care management and care coordination.   Follow Up Plan: A HIPAA compliant phone message was left for the patient providing contact information and requesting a return call. The care management team will reach out to the patient again over the next 1 day. If patient returns call to provider office, please advise to call [place] at [phone].  [name]

## 2020-08-25 ENCOUNTER — Ambulatory Visit (INDEPENDENT_AMBULATORY_CARE_PROVIDER_SITE_OTHER): Payer: Medicare Other | Admitting: Licensed Clinical Social Worker

## 2020-08-25 ENCOUNTER — Telehealth: Payer: Self-pay | Admitting: *Deleted

## 2020-08-25 DIAGNOSIS — E876 Hypokalemia: Secondary | ICD-10-CM

## 2020-08-25 DIAGNOSIS — I10 Essential (primary) hypertension: Secondary | ICD-10-CM

## 2020-08-25 DIAGNOSIS — E119 Type 2 diabetes mellitus without complications: Secondary | ICD-10-CM

## 2020-08-25 DIAGNOSIS — R739 Hyperglycemia, unspecified: Secondary | ICD-10-CM

## 2020-08-25 DIAGNOSIS — J418 Mixed simple and mucopurulent chronic bronchitis: Secondary | ICD-10-CM

## 2020-08-25 NOTE — Chronic Care Management (AMB) (Signed)
  Care Management   Note  08/25/2020 Name: [name] MRN: [id number] DOB: 1951/04/23  Eric Leach is a 70 y.o. year old male who is a primary care patient of Stacks, Cletus Gash, MD and is actively engaged with the care management team. I reached out to Eric Leach by phone today to assist with re-scheduling an initial visit with the Licensed Clinical Social Worker  Follow up plan: Unsuccessful telephone outreach attempt made. A HIPAA compliant phone message was left for the patient providing contact information and requesting a return call.  If patient returns call to provider office, please advise to call [place] at [place] Management

## 2020-08-25 NOTE — Patient Instructions (Signed)
Visit Information  PATIENT GOALS: Goals Addressed            This Visit's Progress   . Find Help in My Community; Complete ADLs as needed       Timeframe:  Short-Term Goal Priority:  High Progress: Not On Track Start Date:                    08/25/20         Expected End Date:                  11/25/20     Follow Up Date 10/02/20   Find Help in My Community (Patient)  Complete ADLs daily, as able    Why is this important?    Knowing how and where to find help for yourself or family in your neighborhood and community is an important skill.   You will want to take some steps to learn how.    Patient Coping Skills: Has some family support Takes medications as prescribed  Patient Deficits  Mobility issues Difficulties in completing ADLs  Patient Goals:   Patient will attend all scheduled medical appointments in next 30 days Patient will talk regularly with son in next 30 days to discuss client needs Patient or son will call RNCM or LCSW as needed in next 30 days for CCM support -  Follow Up Plan: LCSW to call client or his son on 10/02/20       Norva Riffle.Tymier Lindholm MSW, LCSW Licensed Clinical Social Worker [facility] Care Management [phone]

## 2020-08-25 NOTE — Chronic Care Management (AMB) (Signed)
Chronic Care Management    Clinical Social Work Note  08/25/2020 Name: Eric Leach MRN: [id number] DOB: 12-Sep-1950  Eric Leach is a 70 y.o. year old male who is a primary care patient of Stacks, Cletus Gash, MD. The CCM team was consulted to assist the patient with chronic disease management and/or care coordination needs related to: [employer] .   Engaged with patient/ son, Eric Leach by telephone for initial visit in response to provider referral for social work chronic care management and care coordination services.   Consent to Services:  The patient was given the following information about Chronic Care Management services today, agreed to services, and gave verbal consent: 1. CCM service includes personalized support from designated clinical staff supervised by the primary care provider, including individualized plan of care and coordination with other care providers 2. 24/7 contact phone numbers for assistance for urgent and routine care needs. 3. Service will only be billed when office clinical staff spend 20 minutes or more in a month to coordinate care. 4. Only one practitioner may furnish and bill the service in a calendar month. 5.The patient may stop CCM services at any time (effective at the end of the month) by phone call to the office staff. 6. The patient will be responsible for cost sharing (co-pay) of up to 20% of the service fee (after annual deductible is met). Patient agreed to services and consent obtained.  Patient agreed to services and consent obtained.   Assessment: Review of patient past medical history, allergies, medications, and health status, including review of relevant consultants reports was performed today as part of a comprehensive evaluation and provision of chronic care management and care coordination services.     SDOH (Social Determinants of Health) assessments and interventions performed:    Advanced Directives Status: See [place]ynca  application for related entries.  CCM Care Plan  Allergies  Allergen Reactions  . Sulfa Antibiotics     Other reaction(s): Unknown    Outpatient Encounter Medications as of 08/25/2020  Medication Sig  . albuterol (VENTOLIN HFA) 108 (90 Base) MCG/ACT inhaler Inhale 2 puffs into the lungs every 6 (six) hours as needed for wheezing or shortness of breath. (Patient not taking: Reported on 08/21/2020)  . amLODipine (NORVASC) 10 MG tablet Take 1 tablet (10 mg total) by mouth daily.  . blood glucose meter kit and supplies Dispense based on patient and insurance preference. Use up to four times daily as directed. (FOR ICD-10 E10.9, E11.9). (Patient not taking: Reported on 08/21/2020)  . Budeson-Glycopyrrol-Formoterol (BREZTRI AEROSPHERE) 160-9-4.8 MCG/ACT AERO Inhale 2 puffs into the lungs in the morning and at bedtime.  . Continuous Blood Gluc Sensor (FREESTYLE [employer] SENSOR SYSTEM) MISC Measure glucose before meals and at bedtime  . fluticasone furoate-vilanterol (BREO ELLIPTA) 200-25 MCG/INH AEPB Inhale 1 puff into the lungs daily. (Patient not taking: Reported on 08/21/2020)  . hydrochlorothiazide (HYDRODIURIL) 25 MG tablet Take 1 tablet (25 mg total) by mouth daily.  [name] ipratropium-albuterol (DUONEB) 0.5-2.5 (3) MG/3ML SOLN Take 3 mLs by nebulization every 4 (four) hours as needed (breathing).  [name] lisinopril (ZESTRIL) 20 MG tablet Take 1 tablet (20 mg total) by mouth daily.  . metFORMIN (GLUCOPHAGE) 500 MG tablet Take 2 tablets (1,000 mg total) by mouth daily with breakfast.   No facility-administered encounter medications on file as of 08/25/2020.    Patient Active Problem List   Diagnosis Date Noted  . History of drug abuse ([place]) 08/12/2020  . Hypertensive urgency  08/12/2020  . Hyperglycemia 08/12/2020  . Type 2 diabetes mellitus without complication ([place]) 07/14/2246  . Hypomagnesemia 08/12/2020  . Hypokalemia 08/12/2020  . Leukocytosis 08/12/2020  . Mycobacterium avium complex ([place])  03/12/2019  . Mixed simple and mucopurulent chronic bronchitis ([place]) 03/12/2019  . Erectile dysfunction 09/19/2018  . Essential hypertension 09/19/2018    Conditions to be addressed/monitored: Monitor client completion of daily ADLs   Care Plan : LCSW Care Plan  Updates made by Eric Cabal, LCSW since 08/25/2020 12:00 AM    Problem: Coping Skills (General Plan of Care)     Goal: Coping Skills Enhanced;Complete ADLs as needed   Start Date: 08/25/2020  Expected End Date: 11/25/2020  This Visit's Progress: Not on track  Priority: High  Note:   Current barriers:   . Patient in need of assistance with connecting to community resources for possible help in completing ADLs or other daily activities . Patient is unable to independently navigate community resource options without care coordination support . Difficulty with mobility . Self care challenges (recently discharged from the hospital)  Clinical Goals:  patient will work with SW monthly to address concerns related to ADLs completion of client Patient will talk with SW monthly to discuss self care issues of client in home environment Patient will attend scheduled medical appointments in next 30 days Patient or son will call RNCM or LCSW as needed in next 30 days for CCM support  Clinical Interventions:  . Collaboration with Eric Fraise, MD regarding development and update of comprehensive plan of care as evidenced by provider Leach and co-signature . Assessment of needs, barriers of client . Spoke with Eric Leach, son of client, about client needs . Discussed with Eric Leach  the FL-2 completion for client . Talked with [facility] about care facilities in [place] area. . Talked with Eric Leach about family support for client . Talked with Eric Leach about researching care facilities of choice for his father Eric Leach did not have a specific [place] of interest but said he would do research on several Counties in [place] area to see if he found a  place he would like to tour related to possible placement for his father. He said relatives lived closer in the [place] area and he would like to find a facility in the [place] where his father might live) . Talked with [facility] about RNCM support and LCSW support for client . Talked with [facility] about upcoming client medical appointments  Patient Coping Skills: Has some family support Takes medications as prescribed  Patient Deficits  Mobility issues Difficulties in completing ADLs  Patient Goals:   Patient will attend all scheduled medical appointments in next 30 days Patient will talk regularly with son in next 30 days to discuss client needs Patient or son will call RNCM or LCSW as needed in next 30 days for CCM support -  Follow Up Plan: LCSW to call client or his son on 10/02/20     Norva Riffle.Rosalina Dingwall MSW, LCSW Licensed Clinical Social Worker [facility] Care Management [phone]

## 2020-09-03 ENCOUNTER — Telehealth: Payer: Self-pay

## 2020-09-03 NOTE — Telephone Encounter (Signed)
Questions answered and FL2 filled out for 09/07/20 visit

## 2020-09-04 ENCOUNTER — Ambulatory Visit: Payer: Medicare Other | Admitting: *Deleted

## 2020-09-04 DIAGNOSIS — I10 Essential (primary) hypertension: Secondary | ICD-10-CM | POA: Diagnosis not present

## 2020-09-04 DIAGNOSIS — E119 Type 2 diabetes mellitus without complications: Secondary | ICD-10-CM

## 2020-09-04 DIAGNOSIS — J418 Mixed simple and mucopurulent chronic bronchitis: Secondary | ICD-10-CM

## 2020-09-07 ENCOUNTER — Encounter: Payer: Self-pay | Admitting: Family Medicine

## 2020-09-07 ENCOUNTER — Ambulatory Visit (INDEPENDENT_AMBULATORY_CARE_PROVIDER_SITE_OTHER): Payer: Medicare Other | Admitting: Family Medicine

## 2020-09-07 ENCOUNTER — Other Ambulatory Visit: Payer: Self-pay

## 2020-09-07 VITALS — BP 162/93 | HR 87 | Temp 97.9°F | Ht 72.0 in | Wt 147.8 lb

## 2020-09-07 DIAGNOSIS — I10 Essential (primary) hypertension: Secondary | ICD-10-CM | POA: Diagnosis not present

## 2020-09-07 DIAGNOSIS — J418 Mixed simple and mucopurulent chronic bronchitis: Secondary | ICD-10-CM | POA: Diagnosis not present

## 2020-09-07 DIAGNOSIS — F1911 Other psychoactive substance abuse, in remission: Secondary | ICD-10-CM | POA: Diagnosis not present

## 2020-09-07 DIAGNOSIS — F5104 Psychophysiologic insomnia: Secondary | ICD-10-CM

## 2020-09-07 MED ORDER — LISINOPRIL 20 MG PO TABS: 20.0000 mg | ORAL_TABLET | Freq: Every day | ORAL | 2 refills | Status: DC

## 2020-09-07 MED ORDER — QUETIAPINE FUMARATE 25 MG PO TABS: 25.0000 mg | ORAL_TABLET | Freq: Every day | ORAL | 1 refills | Status: AC

## 2020-09-07 NOTE — Patient Instructions (Signed)
Visit Information  PATIENT GOALS: Goals Addressed            This Visit's Progress   . Placement in Long-term Care Facility       Timeframe:  Short-Term Goal Priority:  High Start Date:   09/04/20                          Expected End Date:  10/22/20                      Follow-up:09/14/20 with RNCM  . Patient will attend all scheduled provider appointments . Patient will call provider office for new concerns or questions . Work with PCP office regarding FL2 form completion . Work with son to find an Emergency planning/management officer . Reach out to [facility] team as needed for assistance       The patient verbalized understanding of instructions, educational materials, and care plan provided today and declined offer to receive copy of patient instructions, educational materials, and care plan.   Follow Up Plan:  . Telephone follow up appointment with care management team member scheduled for: 09/14/20 with RNCM, 10/02/20 with LCSW . The patient has been provided with contact information for the care management team and has been advised to call with any health related questions or concerns.  . Next PCP appointment scheduled for: 09/07/20 with Dr Livia Snellen  Chong Sicilian, BSN, RN-BC [place] / [place] Management Direct Dial: [phone]

## 2020-09-07 NOTE — Progress Notes (Signed)
Subjective:  Patient ID: Eric Leach, male    DOB: [birth year]  Age: 70 y.o. MRN: [id number]  CC: Follow-up   HPI Eric Leach presents for recheck of his dyspnea, diabetes and hypertension.  His blood pressure is doing well.  He is somewhat agitated today and was on the way and due to conflict with his son who is also with him today.[name]  He is also here today to have an FL 2 form completed as he has no place to live and needs a placement is soon as possible.  He does not have the inhalers that were recommended currently.  He needs more samples of the breast tree if that can be provided.  Of note is that he is not sleeping well.  He asks for Ambien.  Patient does have a history of illicit drug use and apparent addiction.   Follow-up of hypertension. Patient has no history of headache chest pain or shortness of breath or recent cough. Patient also denies symptoms of TIA such as numbness weakness lateralizing. Patient checks  blood pressure at home and has not had any elevated readings recently. Patient denies side effects from his medication. States taking it regularly.   Depression screen Eric Leach 2/9 09/07/2020 09/07/2020 08/21/2020  Decreased Interest 2 0 0  Down, Depressed, Hopeless 0 0 0  PHQ - 2 Score 2 0 0  Altered sleeping 1 - -  Tired, decreased energy 1 - -  Change in appetite 0 - -  Feeling bad or failure about yourself  0 - -  Trouble concentrating 0 - -  Moving slowly or fidgety/restless 0 - -  Suicidal thoughts 0 - -  PHQ-9 Score 4 - -  Difficult doing work/chores Not difficult at all - -    History [place] has a past medical history of Acute metabolic encephalopathy (5/46/5035), Acute respiratory failure with hypoxemia (HCC) (08/08/2020), COPD (chronic obstructive pulmonary disease) ([place]), Hypertension, Hypertensive urgency (08/12/2020), and Lung cancer ([place]).   He has a past surgical history that includes Lung surgery and Knee arthroscopy.   His family history  includes Cancer in his father and mother; Diabetes in his father; Hypertension in his mother.He reports that he has been smoking cigarettes. He has never used smokeless tobacco. He reports current alcohol use. He reports that he does not use drugs.    ROS Review of Systems  Constitutional: Negative for fever.  Respiratory: Positive for cough and shortness of breath.   Cardiovascular: Negative for chest pain.  Musculoskeletal: Negative for arthralgias.  Skin: Negative for rash.  Psychiatric/Behavioral: Positive for sleep disturbance.    Objective:  BP (!) 162/93   Pulse 87   Temp 97.9 F (36.6 C)   Ht 6' (1.829 m)   Wt 147 lb 12.8 oz (67 kg)   SpO2 94%   BMI 20.05 kg/m   BP Readings from Last 3 Encounters:  09/07/20 (!) 162/93  08/21/20 128/83  08/13/20 111/72    Wt Readings from Last 3 Encounters:  09/07/20 147 lb 12.8 oz (67 kg)  08/21/20 138 lb (62.6 kg)  08/13/20 131 lb 9.8 oz (59.7 kg)     Physical Exam Vitals reviewed.  Constitutional:      Appearance: He is well-developed.  HENT:     Head: Normocephalic and atraumatic.     Right Ear: External ear normal.     Left Ear: External ear normal.     Mouth/Throat:     Pharynx: No oropharyngeal exudate  or posterior oropharyngeal erythema.  Eyes:     Pupils: Pupils are equal, round, and reactive to light.  Cardiovascular:     Rate and Rhythm: Normal rate and regular rhythm.     Heart sounds: No murmur heard.   Pulmonary:     Effort: No respiratory distress.     Breath sounds: Normal breath sounds.  Musculoskeletal:     Cervical back: Normal range of motion and neck supple.  Neurological:     Mental Status: He is alert and oriented to person, place, and time.       Assessment & Plan:   Tamarick was seen today for follow-up.  Diagnoses and all orders for this visit:  Mixed simple and mucopurulent chronic bronchitis ([place]) -     For home use only DME Nebulizer machine -     Ambulatory referral to  Pulmonology  History of drug abuse (Eric Leach)  Psychophysiological insomnia  Essential hypertension  Other orders -     QUEtiapine (SEROQUEL) 25 MG tablet; Take 1 tablet (25 mg total) by mouth at bedtime. -     lisinopril (ZESTRIL) 20 MG tablet; Take 1 tablet (20 mg total) by mouth daily.       I have discontinued Alroy Bailiff Leach's fluticasone furoate-vilanterol. I am also having him start on QUEtiapine. Additionally, I am having him maintain his amLODipine, hydrochlorothiazide, metFORMIN, blood glucose meter kit and supplies, albuterol, FreeStyle [employer], [employer], ipratropium-albuterol, and lisinopril.  Allergies as of 09/07/2020      Reactions   Sulfa Antibiotics    Other reaction(s): Unknown      Medication List       Accurate as of Sep 07, 2020 10:17 PM. If you have any questions, ask your nurse or doctor.        STOP taking these medications   fluticasone furoate-vilanterol 200-25 MCG/INH Aepb Commonly known as: BREO ELLIPTA Stopped by: Eric Fraise, MD     TAKE these medications   albuterol 108 (90 Base) MCG/ACT inhaler Commonly known as: VENTOLIN HFA Inhale 2 puffs into the lungs every 6 (six) hours as needed for wheezing or shortness of breath.   amLODipine 10 MG tablet Commonly known as: NORVASC Take 1 tablet (10 mg total) by mouth daily.   blood glucose meter kit and supplies Dispense based on patient and insurance preference. Use up to four times daily as directed. (FOR ICD-10 E10.9, E11.9).   Breztri Aerosphere 160-9-4.8 MCG/ACT Aero Generic drug: Budeson-Glycopyrrol-Formoterol Inhale 2 puffs into the lungs in the morning and at bedtime.   FreeStyle Libre Sensor System Misc Measure glucose before meals and at bedtime   hydrochlorothiazide 25 MG tablet Commonly known as: HYDRODIURIL Take 1 tablet (25 mg total) by mouth daily.   ipratropium-albuterol 0.5-2.5 (3) MG/3ML Soln Commonly known as: DUONEB Take 3 mLs by  nebulization every 4 (four) hours as needed (breathing).   lisinopril 20 MG tablet Commonly known as: ZESTRIL Take 1 tablet (20 mg total) by mouth daily.   metFORMIN 500 MG tablet Commonly known as: Glucophage Take 2 tablets (1,000 mg total) by mouth daily with breakfast.   QUEtiapine 25 MG tablet Commonly known as: SEROQUEL Take 1 tablet (25 mg total) by mouth at bedtime. Started by: Eric Fraise, MD            Durable Medical Equipment  (From admission, onward)         Start     Ordered   09/07/20 0000  For home  use only DME Nebulizer machine       Comments: Dx:J41.8  Question Answer Comment  Patient needs a nebulizer to treat with the following condition COPD (chronic obstructive pulmonary disease) ([place])   Length of Need Lifetime      09/07/20 1354           Follow-up: Return in about 1 month (around 10/08/2020).  Eric Leach, M.D.

## 2020-09-07 NOTE — Chronic Care Management (AMB) (Signed)
Chronic Care Management   CCM RN Visit Note  09/04/2020 Name: MOLLY MASELLI MRN: [id number] DOB: [birth year]  Subjective: [name] is a 70 y.o. year old male who is a primary care patient of Stacks, Cletus Gash, MD. The care management team was consulted for assistance with disease management and care coordination needs.    Engaged with patient's so, [name], by telephone for follow up visit in response to provider referral for case management and/or care coordination services.   Consent to Services:  The patient was given information about Chronic Care Management services, agreed to services, and gave verbal consent prior to initiation of services.  Please see initial visit note for detailed documentation.   Patient agreed to services and verbal consent obtained.   Assessment: Review of patient past medical history, allergies, medications, health status, including review of consultants reports, laboratory and other test data, was performed as part of comprehensive evaluation and provision of chronic care management services.   SDOH (Social Determinants of Health) assessments and interventions performed:    CCM Care Plan  Allergies  Allergen Reactions  . Sulfa Antibiotics     Other reaction(s): Unknown    Outpatient Encounter Medications as of 09/04/2020  Medication Sig  . albuterol (VENTOLIN HFA) 108 (90 Base) MCG/ACT inhaler Inhale 2 puffs into the lungs every 6 (six) hours as needed for wheezing or shortness of breath.  [name] amLODipine (NORVASC) 10 MG tablet Take 1 tablet (10 mg total) by mouth daily.  . blood glucose meter kit and supplies Dispense based on patient and insurance preference. Use up to four times daily as directed. (FOR ICD-10 E10.9, E11.9).  . Budeson-Glycopyrrol-Formoterol (BREZTRI AEROSPHERE) 160-9-4.8 MCG/ACT AERO Inhale 2 puffs into the lungs in the morning and at bedtime.  . Continuous Blood Gluc Sensor (FREESTYLE LIBRE SENSOR SYSTEM) MISC Measure  glucose before meals and at bedtime  . fluticasone furoate-vilanterol (BREO ELLIPTA) 200-25 MCG/INH AEPB Inhale 1 puff into the lungs daily.  . hydrochlorothiazide (HYDRODIURIL) 25 MG tablet Take 1 tablet (25 mg total) by mouth daily.  [name] ipratropium-albuterol (DUONEB) 0.5-2.5 (3) MG/3ML SOLN Take 3 mLs by nebulization every 4 (four) hours as needed (breathing).  . metFORMIN (GLUCOPHAGE) 500 MG tablet Take 2 tablets (1,000 mg total) by mouth daily with breakfast.  . [DISCONTINUED] lisinopril (ZESTRIL) 20 MG tablet Take 1 tablet (20 mg total) by mouth daily.   No facility-administered encounter medications on file as of 09/04/2020.    Patient Active Problem List   Diagnosis Date Noted  . History of drug abuse ([place]) 08/12/2020  . Hypertensive urgency 08/12/2020  . Hyperglycemia 08/12/2020  . Type 2 diabetes mellitus without complication ([place]) 32/99/2426  . Hypomagnesemia 08/12/2020  . Hypokalemia 08/12/2020  . Leukocytosis 08/12/2020  . Mycobacterium avium complex ([place]) 03/12/2019  . Mixed simple and mucopurulent chronic bronchitis ([place]) 03/12/2019  . Erectile dysfunction 09/19/2018  . Essential hypertension 09/19/2018    Conditions to be addressed/monitored:HTN, COPD and DMII  Care Plan : RNCM: Longterm Care Placement  Updates made by Ilean China, RN since 09/07/2020 12:00 AM    Problem: Lack of Knowledge Regarding Longterm Care Placement   Priority: High    Goal: Obtain Placement in [place]   Start Date: 09/04/2020  This Visit's Progress: On track  Priority: High  Note:   Current Barriers:  [name] Knowledge Deficits related to process for long-term care placement . Care Coordination needs related to long-term care in a patient with DM, HTN, and  COPD . Unable to perform ADLs independently . Unable to perform IADLs independently  Nurse Case Manager Clinical Goal(s):  . patient will verbalize understanding of plan for long-term care arrangements . patient will  work with PCP Office to address needs related to [facility] completion . patient will meet with CCM Team to address questions/concerns related to long-term care placement  Interventions:  . 1:1 collaboration with [name], MD regarding development and update of comprehensive plan of care as evidenced by provider attestation and co-signature . Inter-disciplinary care team collaboration (see longitudinal plan of care) . Chart reviewed including relevant office notes, correspondence notes, lab results, and imaging reports . Reviewed medications with patient 's son, [name], and discussed importance of medication adherence . Discussed family/social support and current living arrangements . Talked with son regarding need for short-term rehab and then long-term assisted living placement. Patient was present    during telephone call and had some input.  . Discussed mobility and ability to perform ADLs . Reviewed upcoming appointment with PCP on 09/07/20 . Advised that FL2 form has been completed and is awaiting PCP signature at visit on 09/07/20 . Discussed insurance coverage . Provided with CCM Team contact information and encouraged to reach out as needed . Discussed plans with patient for ongoing care management follow up and provided patient with direct contact information for care management team  Self Care Activities:  . Patient verbalizes understanding of plan to obtain long-term care  Patient Goals Over the next 30 days, patient will: . Patient will attend all scheduled provider appointments . Patient will call provider office for new concerns or questions . Work with PCP office regarding FL2 form completion . Work with son to find an Emergency planning/management officer . Reach out to [facility] team as needed for assistance     Follow Up Plan:  . Telephone follow up appointment with care management team member scheduled for: 09/14/20 with RNCM, 10/02/20 with LCSW . The patient has been provided with contact  information for the care management team and has been advised to call with any health related questions or concerns.  . Next PCP appointment scheduled for: 09/07/20 with Dr Livia Snellen  Chong Sicilian, BSN, RN-BC [place] [facility] [place] Management Direct Dial: [phone]

## 2020-09-14 ENCOUNTER — Telehealth: Payer: Medicare Other | Admitting: *Deleted

## 2020-10-02 ENCOUNTER — Ambulatory Visit (INDEPENDENT_AMBULATORY_CARE_PROVIDER_SITE_OTHER): Payer: Medicare Other | Admitting: Licensed Clinical Social Worker

## 2020-10-02 DIAGNOSIS — R739 Hyperglycemia, unspecified: Secondary | ICD-10-CM

## 2020-10-02 DIAGNOSIS — E119 Type 2 diabetes mellitus without complications: Secondary | ICD-10-CM

## 2020-10-02 DIAGNOSIS — I10 Essential (primary) hypertension: Secondary | ICD-10-CM | POA: Diagnosis not present

## 2020-10-02 NOTE — Chronic Care Management (AMB) (Signed)
Chronic Care Management    Clinical Social Work Note  10/02/2020 Name: Eric Leach MRN: [id number] DOB: Jun 12, 1950  Eric Leach is a 70 y.o. year old male who is a primary care patient of Eric Leach, Eric Gash, MD. The CCM team was consulted to assist the patient with chronic disease management and/or care coordination needs related to: [employer] .   Engaged with patient Eric Leach of patient, Eric Leach, by telephone for follow up visit in response to provider referral for social work chronic care management and care coordination services.   Consent to Services:  The patient was given information about Chronic Care Management services, agreed to services, and gave verbal consent prior to initiation of services.  Please see initial visit note for detailed documentation.   Patient agreed to services and consent obtained.   Assessment: Review of patient past medical history, allergies, medications, and health status, including review of relevant consultants reports was performed today as part of a comprehensive evaluation and provision of chronic care management and care coordination services.     SDOH (Social Determinants of Health) assessments and interventions performed:  SDOH Interventions    Flowsheet Row Most Recent Value  SDOH Interventions   Depression Interventions/Treatment  Medication        Advanced Directives Status: See Vynca application for related entries.  CCM Care Plan  Allergies  Allergen Reactions   Sulfa Antibiotics     Other reaction(s): Unknown    Outpatient Encounter Medications as of 10/02/2020  Medication Sig   albuterol (VENTOLIN HFA) 108 (90 Base) MCG/ACT inhaler Inhale 2 puffs into the lungs every 6 (six) hours as needed for wheezing or shortness of breath.   amLODipine (NORVASC) 10 MG tablet Take 1 tablet (10 mg total) by mouth daily.   blood glucose meter kit and supplies Dispense based on patient and insurance preference. Use up to  four times daily as directed. (FOR ICD-10 E10.9, E11.9).   Budeson-Glycopyrrol-Formoterol (BREZTRI AEROSPHERE) 160-9-4.8 MCG/ACT AERO Inhale 2 puffs into the lungs in the morning and at bedtime.   Continuous Blood Gluc Sensor (FREESTYLE LIBRE SENSOR SYSTEM) MISC Measure glucose before meals and at bedtime   hydrochlorothiazide (HYDRODIURIL) 25 MG tablet Take 1 tablet (25 mg total) by mouth daily.   ipratropium-albuterol (DUONEB) 0.5-2.5 (3) MG/3ML SOLN Take 3 mLs by nebulization every 4 (four) hours as needed (breathing).   lisinopril (ZESTRIL) 20 MG tablet Take 1 tablet (20 mg total) by mouth daily.   metFORMIN (GLUCOPHAGE) 500 MG tablet Take 2 tablets (1,000 mg total) by mouth daily with breakfast.   QUEtiapine (SEROQUEL) 25 MG tablet Take 1 tablet (25 mg total) by mouth at bedtime.   No facility-administered encounter medications on file as of 10/02/2020.    Patient Active Problem List   Diagnosis Date Noted   Psychophysiological insomnia 09/07/2020   History of drug abuse ([place]) 08/12/2020   Hyperglycemia 08/12/2020   Type 2 diabetes mellitus without complication ([place]) 67/34/1937   Hypomagnesemia 08/12/2020   Hypokalemia 08/12/2020   Leukocytosis 08/12/2020   Mycobacterium avium complex ([place]) 03/12/2019   Mixed simple and mucopurulent chronic bronchitis ([place]) 03/12/2019   Erectile dysfunction 09/19/2018   Essential hypertension 09/19/2018    Conditions to be addressed/monitored: Monitor client completion of ADLs, as able  Care Plan : [place]  Updates made by Katha Cabal, LCSW since 10/02/2020 12:00 AM     Problem: Coping Skills (General Plan of Care)      Goal: Coping Skills  Enhanced;Complete ADLs as needed   Start Date: 08/25/2020  Expected End Date: 11/25/2020  This Visit's Progress: Not on track  Recent Progress: Not on track  Priority: High  Note:   Current barriers:   Patient in need of assistance with connecting to community resources for possible help in  completing ADLs or other daily activities Patient is unable to independently navigate community resource options without care coordination support Difficulty with mobility Self care challenges   Clinical Goals:  patient will work with SW monthly to address concerns related to ADLs completion of client Patient will talk with SW monthly to discuss self care issues of client in home environment Patient will attend scheduled medical appointments in next 30 days Patient or son will call RNCM or LCSW as needed in next 30 days for CCM support  Clinical Interventions:  Collaboration with [name], MD regarding development and update of comprehensive plan of care as evidenced by provider attestation and co-signature Spoke with [name], son of client, about client needs Discussed with [name]  the FL-2 completion for client Talked with [name] about care facilities in [place] area. [name] encouraged LCSW to call [name], sister of client  to discuss client needs Talked with [name] about family support for client Talked with [name] about RNCM support and LCSW support for client Talked with [name] about client's hobbies, interests Talked with [facility] about care options for client  Patient Coping Skills: Has some family support Takes medications as prescribed  Patient Deficits  Mobility issues Difficulties in completing ADLs  Patient Goals:   Patient will attend all scheduled medical appointments in next 30 days Patient will talk regularly with son in next 30 days to discuss client needs Patient or son will call RNCM or LCSW as needed in next 30 days for CCM support -  Follow Up Plan: LCSW to call client or his son on 11/17/20      [name] MSW, LCSW Licensed Clinical Social Worker [facility] Care Management [phone]

## 2020-10-02 NOTE — Patient Instructions (Signed)
Visit Information  PATIENT GOALS:  Goals Addressed             This Visit's Progress    Find Help in My Community; Complete ADLs as needed       Timeframe:  Short-Term Goal Priority:  High Progress: Not On Track Start Date:                    08/25/20         Expected End Date:                  11/25/20     Follow Up Date 11/17/20   Find Help in My Community (Patient)  Complete ADLs daily, as able    Why is this important?   Knowing how and where to find help for yourself or family in your neighborhood and community is an important skill.  You will want to take some steps to learn how.    Patient Coping Skills: Has some family support Takes medications as prescribed  Patient Deficits  Mobility issues Difficulties in completing ADLs  Patient Goals:   Patient will attend all scheduled medical appointments in next 30 days Patient will talk regularly with son in next 30 days to discuss client needs Patient or son will call RNCM or LCSW as needed in next 30 days for CCM support -  Follow Up Plan: LCSW to call client or his son on 11/17/20          Norva Riffle.Jadi Deyarmin MSW, LCSW Licensed Clinical Social Worker [facility] Care Management [phone]

## 2020-10-05 ENCOUNTER — Telehealth: Payer: Medicare Other

## 2020-10-13 ENCOUNTER — Ambulatory Visit: Payer: Medicare Other | Admitting: Family Medicine

## 2020-10-14 ENCOUNTER — Encounter: Payer: Self-pay | Admitting: Family Medicine

## 2020-10-19 ENCOUNTER — Institutional Professional Consult (permissible substitution): Payer: Medicare Other | Admitting: Pulmonary Disease

## 2020-11-17 ENCOUNTER — Ambulatory Visit (INDEPENDENT_AMBULATORY_CARE_PROVIDER_SITE_OTHER): Payer: Medicare Other | Admitting: Licensed Clinical Social Worker

## 2020-11-17 DIAGNOSIS — Z59819 Housing instability, housed unspecified: Secondary | ICD-10-CM

## 2020-11-17 DIAGNOSIS — E119 Type 2 diabetes mellitus without complications: Secondary | ICD-10-CM

## 2020-11-17 DIAGNOSIS — I10 Essential (primary) hypertension: Secondary | ICD-10-CM | POA: Diagnosis not present

## 2020-11-17 DIAGNOSIS — R739 Hyperglycemia, unspecified: Secondary | ICD-10-CM

## 2020-11-17 DIAGNOSIS — Z599 Problem related to housing and economic circumstances, unspecified: Secondary | ICD-10-CM

## 2020-11-17 NOTE — Chronic Care Management (AMB) (Signed)
Chronic Care Management    Clinical Social Work Note  11/17/2020 Name: Eric Leach MRN: [id number] DOB: January 21, 1951  [name] is a 70 y.o. year old male who is a primary care patient of Stacks, Cletus Gash, MD. The CCM team was consulted to assist the patient with chronic disease management and/or care coordination needs related to: [employer] .   Engaged with patient Tora Duck of patient, [name] by telephone for follow up visit in response to provider referral for social work chronic care management and care coordination services.   Consent to Services:  The patient was given information about Chronic Care Management services, agreed to services, and gave verbal consent prior to initiation of services.  Please see initial visit note for detailed documentation.   Patient agreed to services and consent obtained.   Assessment: Review of patient past medical history, allergies, medications, and health status, including review of relevant consultants reports was performed today as part of a comprehensive evaluation and provision of chronic care management and care coordination services.     SDOH (Social Determinants of Health) assessments and interventions performed:  SDOH Interventions    Flowsheet Row Most Recent Value  SDOH Interventions   Depression Interventions/Treatment  Medication        Advanced Directives Status: See Vynca application for related entries.  CCM Care Plan  Allergies  Allergen Reactions   Sulfa Antibiotics     Other reaction(s): Unknown    Outpatient Encounter Medications as of 11/17/2020  Medication Sig   albuterol (VENTOLIN HFA) 108 (90 Base) MCG/ACT inhaler Inhale 2 puffs into the lungs every 6 (six) hours as needed for wheezing or shortness of breath.   amLODipine (NORVASC) 10 MG tablet Take 1 tablet (10 mg total) by mouth daily.   blood glucose meter kit and supplies Dispense based on patient and insurance preference. Use  up to four times daily as directed. (FOR ICD-10 E10.9, E11.9).   Budeson-Glycopyrrol-Formoterol (BREZTRI AEROSPHERE) 160-9-4.8 MCG/ACT AERO Inhale 2 puffs into the lungs in the morning and at bedtime.   Continuous Blood Gluc Sensor (FREESTYLE LIBRE SENSOR SYSTEM) MISC Measure glucose before meals and at bedtime   hydrochlorothiazide (HYDRODIURIL) 25 MG tablet Take 1 tablet (25 mg total) by mouth daily.   ipratropium-albuterol (DUONEB) 0.5-2.5 (3) MG/3ML SOLN Take 3 mLs by nebulization every 4 (four) hours as needed (breathing).   lisinopril (ZESTRIL) 20 MG tablet Take 1 tablet (20 mg total) by mouth daily.   metFORMIN (GLUCOPHAGE) 500 MG tablet Take 2 tablets (1,000 mg total) by mouth daily with breakfast.   QUEtiapine (SEROQUEL) 25 MG tablet Take 1 tablet (25 mg total) by mouth at bedtime.   No facility-administered encounter medications on file as of 11/17/2020.    Patient Active Problem List   Diagnosis Date Noted   Psychophysiological insomnia 09/07/2020   History of drug abuse ([place]) 08/12/2020   Hyperglycemia 08/12/2020   Type 2 diabetes mellitus without complication ([place]) 74/11/1446   Hypomagnesemia 08/12/2020   Hypokalemia 08/12/2020   Leukocytosis 08/12/2020   Mycobacterium avium complex ([place]) 03/12/2019   Mixed simple and mucopurulent chronic bronchitis ([place]) 03/12/2019   Erectile dysfunction 09/19/2018   Essential hypertension 09/19/2018    Conditions to be addressed/monitored: monitor client completion of ADLs; monitor client management of housing needs of client   Care Plan : [place]  Updates made by Katha Cabal, LCSW since 11/17/2020 12:00 AM     Problem: Coping Skills (General Plan of Care)  Goal: Coping Skills Enhanced;Complete ADLs as needed   Start Date: 11/17/2020  Expected End Date: 02/09/2021  This Visit's Progress: On track  Recent Progress: Not on track  Priority: High  Note:   Current barriers:   Patient in need of assistance with  connecting to community resources for possible help in completing ADLs or other daily activities Patient is unable to independently navigate community resource options without care coordination support Difficulty with mobility Self care challenges  Housing needs  Clinical Goals:  patient will work with SW monthly to address concerns related to ADLs completion of client Patient will talk with SW monthly to discuss self care issues of client in home environment Patient will talk with LCSW in next 30 days to discuss housing resources for client Patient or son will call RNCM or LCSW as needed in next 30 days for CCM support  Clinical Interventions:  Collaboration with [name], MD regarding development and update of comprehensive plan of care as evidenced by provider attestation and co-signature Spoke with client about client needs Talked with [name], sister of client about client needs Talked with Allene Dillon about FL-2 completion for client Talked with client and Allene Dillon  about care facilities in [place] and in the [place] area. Talked with client and with Allene Dillon  about RNCM support and LCSW support for client Talked with client and with Allene Dillon about care options for client Talked with client and Allene Dillon about apartment or housing resources in [place], [place].  LCSW gave client and Allene Dillon the names and phone numbers for 3 housing complexes in [place], [place]. [place] also gave [place] phone number for [employer]. LCSW also gave Allene Dillon the names and phone numbers for numerous housing complexes in [place] ,[place] Provided counseling support for client Talked with Allene Dillon about PCP care in the area [employer] phone number for LCSW [phone]) and invited client or Allene Dillon to call LCSW as needed to further discuss housing resources for client in the area  Patient Coping Skills: Has some family support Takes medications as  prescribed  Patient Deficits  Mobility issues Difficulties in completing ADLs Housing needs  Patient Goals:   Patient will attend all scheduled medical appointments in next 30 days Patient will talk regularly with son in next 30 days to discuss client needs Patient or son will call RNCM or LCSW as needed in next 30 days for CCM support -  Follow Up Plan: LCSW to call client or his sister on 12/24/20     Norva Riffle.Avin Upperman MSW, LCSW Licensed Clinical Social Worker [facility] Care Management [phone]

## 2020-11-17 NOTE — Addendum Note (Signed)
Addended by: Katha Cabal on: 11/17/2020 03:53 PM   Modules accepted: Orders

## 2020-11-17 NOTE — Patient Instructions (Signed)
Visit Information  PATIENT GOALS:  Goals Addressed             This Visit's Progress    Find Help in My Community; Complete ADLs as needed       Timeframe:  Short-Term Goal Priority:  High Progress: On Track Start Date:                    11/17/20         Expected End Date:                  02/09/21     Follow Up Date 12/24/20   Find Help in My Community (Patient)  Complete ADLs daily, as able    Why is this important?   Knowing how and where to find help for yourself or family in your neighborhood and community is an important skill.  You will want to take some steps to learn how.    Patient Coping Skills: Has some family support Takes medications as prescribed  Patient Deficits  Mobility issues Difficulties in completing ADLs Housing needs  Patient Goals:   Patient will attend all scheduled medical appointments in next 30 days Patient will talk regularly with son in next 30 days to discuss client needs Patient or son will call RNCM or LCSW as needed in next 30 days for CCM support -  Follow Up Plan: LCSW to call client or sister of client on 12/24/20           Eric Leach MSW, LCSW Licensed Clinical Social Worker [facility] Care Management [phone]

## 2020-11-18 ENCOUNTER — Telehealth: Payer: Self-pay | Admitting: *Deleted

## 2020-11-18 ENCOUNTER — Institutional Professional Consult (permissible substitution): Payer: Medicare Other | Admitting: Pulmonary Disease

## 2020-11-18 NOTE — Progress Notes (Deleted)
Synopsis: Referred in July 2022 for bronchitis by [name], MD  Subjective:   PATIENT ID: Eric Leach II GENDER: male DOB: April 12, 1951, MRN: [id number]   HPI  No chief complaint on file.   Cadel Stairs II is a 70 year old male, daily smoker with history of hypertension, lung cancer and COPD who is referred to pulmonary clinic for evaluation of chronic bronchitis.     Past Medical History:  Diagnosis Date   Acute metabolic encephalopathy 06/04/4707   Acute respiratory failure with hypoxemia ([place]) 08/08/2020   COPD (chronic obstructive pulmonary disease) (HCC)    Hypertension    Hypertensive urgency 08/12/2020   Lung cancer ([place])      Family History  Problem Relation Age of Onset   Hypertension Mother    Cancer Mother        breast   Cancer Father    Diabetes Father      Social History   Socioeconomic History   Marital status: Divorced    Spouse name: Not on file   Number of children: Not on file   Years of education: Not on file   Highest education level: Not on file  Occupational History   Not on file  Tobacco Use   Smoking status: Every Day    Types: Cigarettes   Smokeless tobacco: Never  Vaping Use   Vaping Use: Never used  Substance and Sexual Activity   Alcohol use: Yes    Comment: Very little.   Drug use: No   Sexual activity: Not on file  Other Topics Concern   Not on file  Social History Narrative   Not on file   Social Determinants of Health   Financial Resource Strain: Low Risk    Difficulty of Paying Living Expenses: Not hard at all  Food Insecurity: No Food Insecurity   Worried About Running Out of Food in the Last Year: Never true   [place] in the Last Year: Never true  Transportation Needs: No Transportation Needs   Lack of Transportation (Medical): No   Lack of Transportation (Non-Medical): No  Physical Activity: Inactive   Days of Exercise per Week: 0 days   Minutes of Exercise per Session: 0 min  Stress: Not  on file  Social Connections: Socially Isolated   Frequency of Communication with Friends and Family: More than three times a week   Frequency of Social Gatherings with Friends and Family: More than three times a week   Attends Religious Services: Never   Marine scientist or Organizations: No   Attends Music therapist: Never   Marital Status: Divorced  Human resources officer Violence: Not on file     Allergies  Allergen Reactions   Sulfa Antibiotics     Other reaction(s): Unknown     Outpatient Medications Prior to Visit  Medication Sig Dispense Refill   albuterol (VENTOLIN HFA) 108 (90 Base) MCG/ACT inhaler Inhale 2 puffs into the lungs every 6 (six) hours as needed for wheezing or shortness of breath. 8 g 2   amLODipine (NORVASC) 10 MG tablet Take 1 tablet (10 mg total) by mouth daily. 30 tablet 2   blood glucose meter kit and supplies Dispense based on patient and insurance preference. Use up to four times daily as directed. (FOR ICD-10 E10.9, E11.9). 1 each 0   Budeson-Glycopyrrol-Formoterol (BREZTRI AEROSPHERE) 160-9-4.8 MCG/ACT AERO Inhale 2 puffs into the lungs in the morning and at bedtime. 10.7 g 11  Continuous Blood Gluc Sensor (FREESTYLE LIBRE SENSOR SYSTEM) MISC Measure glucose before meals and at bedtime 1 each 26   hydrochlorothiazide (HYDRODIURIL) 25 MG tablet Take 1 tablet (25 mg total) by mouth daily. 30 tablet 2   ipratropium-albuterol (DUONEB) 0.5-2.5 (3) MG/3ML SOLN Take 3 mLs by nebulization every 4 (four) hours as needed (breathing). 360 mL 5   lisinopril (ZESTRIL) 20 MG tablet Take 1 tablet (20 mg total) by mouth daily. 30 tablet 2   metFORMIN (GLUCOPHAGE) 500 MG tablet Take 2 tablets (1,000 mg total) by mouth daily with breakfast. 60 tablet 2   QUEtiapine (SEROQUEL) 25 MG tablet Take 1 tablet (25 mg total) by mouth at bedtime. 90 tablet 1   No facility-administered medications prior to visit.    ROS    Objective:  There were no vitals filed  for this visit.   Physical Exam    CBC    Component Value Date/Time   WBC 11.1 (H) 08/21/2020 1135   WBC 20.3 (H) 08/13/2020 0453   RBC 5.15 08/21/2020 1135   RBC 5.48 08/13/2020 0453   HGB 14.5 08/21/2020 1135   HGB 15.6 04/21/2006 1349   HCT 43.9 08/21/2020 1135   HCT 44.6 04/21/2006 1349   PLT 335 08/21/2020 1135   MCV 85 08/21/2020 1135   MCV 89.2 04/21/2006 1349   MCH 28.2 08/21/2020 1135   MCH 28.5 08/13/2020 0453   MCHC 33.0 08/21/2020 1135   MCHC 34.3 08/13/2020 0453   RDW 14.9 08/21/2020 1135   RDW 13.3 04/21/2006 1349   LYMPHSABS 1.9 08/21/2020 1135   LYMPHSABS 2.2 04/21/2006 1349   MONOABS 1.3 (H) 08/08/2020 1856   MONOABS 0.8 04/21/2006 1349   EOSABS 0.1 08/21/2020 1135   BASOSABS 0.1 08/21/2020 1135   BASOSABS 0.1 04/21/2006 1349     Chest imaging: CXR 08/08/20 Cardiac shadow is stable. Aortic calcifications are seen. Chronic scarring in the left apex is seen with postsurgical changes. No focal infiltrate or sizable effusion is seen. Previously noted changes in the right lung are no longer identified. No bony abnormality is seen.  CT Chest 10/24/2018 1. New irregular 2.9 cm nodular peribronchovascular focus of consolidation in the basilar right lower lobe, more likely due to bronchopneumonia given the rapid appearance since 08/23/2018 chest CT. Recommend attention on follow-up chest CT in 3 months. 2. Thick walled 8.1 cm cavitary lesion in the apical left upper lobe abutting the suture line is stable. While post treatment change is favored, continued close chest CT surveillance is recommended. 3. Partially cavitary 1.1 cm peripheral right upper lobe nodule is stable since 08/23/2018 chest CT, although new since 10/05/2017 chest CT, also warranting continued close CT surveillance in 3 months. 4. No thoracic adenopathy.  PFT: No flowsheet data found.  Labs:  Path:  Echo:  Heart Catheterization:       Assessment & Plan:   No diagnosis  found.  Discussion: ***    Current Outpatient Medications:    albuterol (VENTOLIN HFA) 108 (90 Base) MCG/ACT inhaler, Inhale 2 puffs into the lungs every 6 (six) hours as needed for wheezing or shortness of breath., Disp: 8 g, Rfl: 2   amLODipine (NORVASC) 10 MG tablet, Take 1 tablet (10 mg total) by mouth daily., Disp: 30 tablet, Rfl: 2   blood glucose meter kit and supplies, Dispense based on patient and insurance preference. Use up to four times daily as directed. (FOR ICD-10 E10.9, E11.9)., Disp: 1 each, Rfl: 0   Budeson-Glycopyrrol-Formoterol (BREZTRI AEROSPHERE) 160-9-4.8  MCG/ACT AERO, Inhale 2 puffs into the lungs in the morning and at bedtime., Disp: 10.7 g, Rfl: 11   Continuous Blood Gluc Sensor (FREESTYLE LIBRE SENSOR SYSTEM) MISC, Measure glucose before meals and at bedtime, Disp: 1 each, Rfl: 26   hydrochlorothiazide (HYDRODIURIL) 25 MG tablet, Take 1 tablet (25 mg total) by mouth daily., Disp: 30 tablet, Rfl: 2   ipratropium-albuterol (DUONEB) 0.5-2.5 (3) MG/3ML SOLN, Take 3 mLs by nebulization every 4 (four) hours as needed (breathing)., Disp: 360 mL, Rfl: 5   lisinopril (ZESTRIL) 20 MG tablet, Take 1 tablet (20 mg total) by mouth daily., Disp: 30 tablet, Rfl: 2   metFORMIN (GLUCOPHAGE) 500 MG tablet, Take 2 tablets (1,000 mg total) by mouth daily with breakfast., Disp: 60 tablet, Rfl: 2   QUEtiapine (SEROQUEL) 25 MG tablet, Take 1 tablet (25 mg total) by mouth at bedtime., Disp: 90 tablet, Rfl: 1

## 2020-11-18 NOTE — Telephone Encounter (Signed)
   Telephone encounter was:  Unsuccessful.  11/18/2020 Name: Eric Leach MRN: [id number] DOB: 05-26-1950  Unsuccessful outbound call made today to assist with:  Transportation Needs  and Food Insecurity  Outreach Attempt:  1st Attempt  A HIPAA compliant voice message was left requesting a return call.  Instructed patient to call back at   Instructed patient to call back at [phone]  at their earliest convenience.   [place], Care Management  [phone] [address] , [place] [address] Email : [email or web address]

## 2020-11-20 ENCOUNTER — Telehealth: Payer: Medicare Other

## 2020-11-20 ENCOUNTER — Telehealth: Payer: Self-pay | Admitting: *Deleted

## 2020-11-20 NOTE — Telephone Encounter (Signed)
   Telephone encounter was:  Unsuccessful.  11/20/2020 Name: ALESANDRO STUEVE MRN: [id number] DOB: 1951/04/14  Unsuccessful outbound call made today to assist with:  Transportation Needs  and Food Insecurity  Outreach Attempt:  2nd Attempt  A HIPAA compliant voice message was left requesting a return call.  Instructed patient to call back at   Instructed patient to call back at [phone]  at their earliest convenience.   [place], Care Management  [phone] [address] , [place] [address] Email : [email or web address] [email or web address]

## 2020-11-23 ENCOUNTER — Telehealth: Payer: Self-pay | Admitting: Family Medicine

## 2020-11-23 ENCOUNTER — Ambulatory Visit (INDEPENDENT_AMBULATORY_CARE_PROVIDER_SITE_OTHER): Payer: Medicare Other | Admitting: *Deleted

## 2020-11-23 ENCOUNTER — Telehealth: Payer: Self-pay | Admitting: *Deleted

## 2020-11-23 DIAGNOSIS — E119 Type 2 diabetes mellitus without complications: Secondary | ICD-10-CM | POA: Diagnosis not present

## 2020-11-23 DIAGNOSIS — Z599 Problem related to housing and economic circumstances, unspecified: Secondary | ICD-10-CM

## 2020-11-23 DIAGNOSIS — I10 Essential (primary) hypertension: Secondary | ICD-10-CM

## 2020-11-23 DIAGNOSIS — Z59819 Housing instability, housed unspecified: Secondary | ICD-10-CM

## 2020-11-23 MED ORDER — LISINOPRIL 20 MG PO TABS: 20.0000 mg | ORAL_TABLET | Freq: Every day | ORAL | 0 refills | Status: AC

## 2020-11-23 MED ORDER — METFORMIN HCL 500 MG PO TABS: 1000.0000 mg | ORAL_TABLET | Freq: Every day | ORAL | 0 refills | Status: DC

## 2020-11-23 NOTE — Telephone Encounter (Signed)
  Prescription Request  11/23/2020  What is the name of the medication or equipment? Metformin and Lisinopril  Have you contacted your pharmacy to request a refill? (if applicable) Yes  Which pharmacy would you like this sent to? [facility], [place]  Pt needs refills on both meds to last him until he comes in for his check up on 12/16/20

## 2020-11-23 NOTE — Telephone Encounter (Signed)
   Telephone encounter was:  Unsuccessful.  11/23/2020 Name: Eric Leach MRN: [id number] DOB: 03/30/1951  Unsuccessful outbound call made today to assist with:  [phone] mailbox ful no messages accepted  Outreach Attempt:  3rd Attempt.  Referral closed unable to contact patient. [phone] mailbox ful no messages accepted [place], Care Management  [phone] [address] , [place] [address] Email : [email or web address]

## 2020-11-23 NOTE — Patient Instructions (Signed)
Visit Information  PATIENT GOALS:  Goals Addressed             This Visit's Progress    Work with CCM Team Regarding Housing       Timeframe:  Short-Term Goal Priority:  High Start Date:   11/23/20                         Expected End Date:  01/22/21                     Follow-up:  Talk with [facility] Care Guides regarding a list of Assisted Living Facilities in [place] out to [facility] as needed [phone] Reach out to LCSW as needed [phone] Follow up with PCP as directed        Patient verbalizes understanding of instructions provided today and agrees to view in [place].   Plan:Telephone follow up appointment with care management team member scheduled for:  12/24/20 with LCSW and The patient has been provided with contact information for the care management team and has been advised to call with any health related questions or concerns.    Chong Sicilian, BSN, RN-BC Embedded Chronic Care Manager [facility] / [place] Management Direct Dial: [phone]

## 2020-11-23 NOTE — Chronic Care Management (AMB) (Signed)
Chronic Care Management   CCM RN Visit Note  11/23/2020 Name: Eric Leach MRN: [id number] DOB: 19-Feb-1951  Subjective: Eric Leach is a 70 y.o. year old male who is a primary care patient of Stacks, Cletus Gash, MD. The care management team was consulted for assistance with disease management and care coordination needs.    Engaged with patient by telephone for follow up visit in response to provider referral for case management and/or care coordination services.   Consent to Services:  The patient was given information about Chronic Care Management services, agreed to services, and gave verbal consent prior to initiation of services.  Please see initial visit note for detailed documentation.   Patient agreed to services and verbal consent obtained.   Assessment: Review of patient past medical history, allergies, medications, health status, including review of consultants reports, laboratory and other test data, was performed as part of comprehensive evaluation and provision of chronic care management services.   SDOH (Social Determinants of Health) assessments and interventions performed:    CCM Care Plan  Allergies  Allergen Reactions   Sulfa Antibiotics     Other reaction(s): Unknown    Outpatient Encounter Medications as of 11/23/2020  Medication Sig   albuterol (VENTOLIN HFA) 108 (90 Base) MCG/ACT inhaler Inhale 2 puffs into the lungs every 6 (six) hours as needed for wheezing or shortness of breath.   amLODipine (NORVASC) 10 MG tablet Take 1 tablet (10 mg total) by mouth daily.   blood glucose meter kit and supplies Dispense based on patient and insurance preference. Use up to four times daily as directed. (FOR ICD-10 E10.9, E11.9).   Budeson-Glycopyrrol-Formoterol (BREZTRI AEROSPHERE) 160-9-4.8 MCG/ACT AERO Inhale 2 puffs into the lungs in the morning and at bedtime.   Continuous Blood Gluc Sensor (FREESTYLE LIBRE SENSOR SYSTEM) MISC Measure glucose before meals and  at bedtime   hydrochlorothiazide (HYDRODIURIL) 25 MG tablet Take 1 tablet (25 mg total) by mouth daily.   ipratropium-albuterol (DUONEB) 0.5-2.5 (3) MG/3ML SOLN Take 3 mLs by nebulization every 4 (four) hours as needed (breathing).   lisinopril (ZESTRIL) 20 MG tablet Take 1 tablet (20 mg total) by mouth daily.   metFORMIN (GLUCOPHAGE) 500 MG tablet Take 2 tablets (1,000 mg total) by mouth daily with breakfast.   QUEtiapine (SEROQUEL) 25 MG tablet Take 1 tablet (25 mg total) by mouth at bedtime.   No facility-administered encounter medications on file as of 11/23/2020.    Patient Active Problem List   Diagnosis Date Noted   Psychophysiological insomnia 09/07/2020   History of drug abuse ([place]) 08/12/2020   Hyperglycemia 08/12/2020   Type 2 diabetes mellitus without complication ([place]) 09/47/0962   Hypomagnesemia 08/12/2020   Hypokalemia 08/12/2020   Leukocytosis 08/12/2020   Mycobacterium avium complex ([place]) 03/12/2019   Mixed simple and mucopurulent chronic bronchitis ([place]) 03/12/2019   Erectile dysfunction 09/19/2018   Essential hypertension 09/19/2018    Conditions to be addressed/monitored:HTN, DMII, and housing needs  Care Plan : [place]  Updates made by Ilean China, RN since 11/23/2020 12:00 AM     Problem: Care Coordination Needs   Priority: High     Goal: Work with CCM Team to [place]   Start Date: 11/23/2020  This Visit's Progress: On track  Recent Progress: On track  Priority: High  Note:   Current Barriers:  Care Coordination needs related to Housing barriers in a patient with HTN and DMII  RNCM Clinical Goal(s):  Patient will  work with All embedded care coordination to address needs related to HTN and DMII and Housing barriers demonstrate improved health management independence through collaboration with Consulting civil engineer, provider, and care team.   Interventions: 1:1 collaboration with primary care provider regarding development  and update of comprehensive plan of care as evidenced by provider attestation and co-signature Inter-disciplinary care team collaboration (see longitudinal plan of care)   SDOH Barriers (Status: New goal.)  Talked with patient's sister, Eric Leach, regarding need for housing assistance. Discussed current family/social support. Son is not as involved. Patient is able to perform ADLs but Eric Leach provides housing and assistance with medical management. She is working to Programme researcher, broadcasting/film/video. Reviewed recent telephone visit note with LCSW Collaborated with LCSW regarding patient's need and assisted in placing referral to [place] for assistance with housing and requested that they provide a list of Assisted Living Facilities in [facility] and surrounding area.  Collaborated with [facility] Care Guide regarding contact information Discussed plans with patient for ongoing care management follow up and provided patient with direct contact information for care management team Advised patient to reach out to LCSW or RNCM as needed Provided education and assistance to client regarding Advanced Directives. Provided sister, Eric Leach, with general information about Assisted Living Facilities  Patient Goals/Self-Care Activities: Patient will self administer medications as prescribed Patient will attend all scheduled provider appointments Patient will continue to perform ADL's independently       Plan:Telephone follow up appointment with care management team member scheduled for:  12/24/20 with LCSW and The patient has been provided with contact information for the care management team and has been advised to call with any health related questions or concerns.    Chong Sicilian, BSN, RN-BC Embedded Chronic Care Manager [facility] / [place] Management Direct Dial: [phone]

## 2020-11-23 NOTE — Telephone Encounter (Signed)
Refills sent to pharmacy, patient aware.

## 2020-12-16 ENCOUNTER — Ambulatory Visit: Payer: Medicare Other | Admitting: Family Medicine

## 2020-12-24 ENCOUNTER — Telehealth: Payer: Medicare Other

## 2021-01-14 ENCOUNTER — Ambulatory Visit: Payer: Medicare Other | Admitting: Family Medicine

## 2021-01-15 ENCOUNTER — Encounter: Payer: Self-pay | Admitting: Family Medicine

## 2021-02-05 ENCOUNTER — Telehealth: Payer: Medicare Other

## 2021-02-08 ENCOUNTER — Telehealth: Payer: Medicare Other

## 2021-03-02 ENCOUNTER — Telehealth: Payer: Self-pay | Admitting: Family Medicine

## 2021-03-02 NOTE — Telephone Encounter (Signed)
Left message for patient to call back and schedule Medicare Annual Wellness Visit (AWV) either virtually.   *due 06/23/2017 awvi per [facility]  please schedule at anytime with health coach  This should be a 45 minute visit.

## 2021-03-16 ENCOUNTER — Telehealth: Payer: Medicare Other

## 2021-03-17 ENCOUNTER — Other Ambulatory Visit: Payer: Self-pay | Admitting: Family Medicine

## 2021-04-08 ENCOUNTER — Ambulatory Visit (INDEPENDENT_AMBULATORY_CARE_PROVIDER_SITE_OTHER): Payer: Medicare Other | Admitting: Licensed Clinical Social Worker

## 2021-04-08 ENCOUNTER — Telehealth: Payer: Self-pay | Admitting: Family Medicine

## 2021-04-08 DIAGNOSIS — I10 Essential (primary) hypertension: Secondary | ICD-10-CM

## 2021-04-08 DIAGNOSIS — E119 Type 2 diabetes mellitus without complications: Secondary | ICD-10-CM

## 2021-04-08 DIAGNOSIS — Z59819 Housing instability, housed unspecified: Secondary | ICD-10-CM

## 2021-04-08 DIAGNOSIS — R739 Hyperglycemia, unspecified: Secondary | ICD-10-CM

## 2021-04-08 NOTE — Telephone Encounter (Signed)
Called to schedule AWV. Unable to leave message.

## 2021-04-08 NOTE — Chronic Care Management (AMB) (Signed)
°Chronic Care Management  ° ° Clinical Social Work Note ° °04/08/2021 °Name: Eric Leach MRN: [id number] DOB: 08/17/1950 ° °Wiliam W Ceja Leach is a 70 y.o. year old male who is a primary care patient of Stacks, Warren, MD. The CCM team was consulted to assist the patient with chronic disease management and/or care coordination needs related to: Community Resources .  ° °Engaged with patient / sister of client, Eric Leach,by telephone for follow up visit in response to provider referral for social work chronic care management and care coordination services.  ° °Consent to Services:  °The patient was given information about Chronic Care Management services, agreed to services, and gave verbal consent prior to initiation of services.  Please see initial visit note for detailed documentation.  ° °Patient agreed to services and consent obtained.  ° °Assessment: Review of patient past medical history, allergies, medications, and health status, including review of relevant consultants reports was performed today as part of a comprehensive evaluation and provision of chronic care management and care coordination services.    ° °SDOH (Social Determinants of Health) assessments and interventions performed:  °SDOH Interventions   ° °Flowsheet Row Most Recent Value  °SDOH Interventions   °Housing Interventions Other (Comment)  [has difficulty paying to stay in a hotel at present. His sister helps him pay hotel bill]  °Stress Interventions Provide Counseling  [client has stress related to housing needs . client has stress related to managing medical needs.]  °Depression Interventions/Treatment  Medication  ° °  °  ° °Advanced Directives Status: See Vynca application for related entries. ° °CCM Care Plan ° °Allergies  °Allergen Reactions  ° Sulfa Antibiotics   °  Other reaction(s): Unknown  ° ° °Outpatient Encounter Medications as of 04/08/2021  °Medication Sig  ° albuterol (VENTOLIN HFA) 108 (90 Base) MCG/ACT  inhaler Inhale 2 puffs into the lungs every 6 (six) hours as needed for wheezing or shortness of breath.  ° amLODipine (NORVASC) 10 MG tablet Take 1 tablet (10 mg total) by mouth daily.  ° blood glucose meter kit and supplies Dispense based on patient and insurance preference. Use up to four times daily as directed. (FOR ICD-10 E10.9, E11.9).  ° Budeson-Glycopyrrol-Formoterol (BREZTRI AEROSPHERE) 160-9-4.8 MCG/ACT AERO Inhale 2 puffs into the lungs in the morning and at bedtime.  ° Continuous Blood Gluc Sensor (FREESTYLE LIBRE SENSOR SYSTEM) MISC Measure glucose before meals and at bedtime  ° hydrochlorothiazide (HYDRODIURIL) 25 MG tablet Take 1 tablet (25 mg total) by mouth daily.  ° ipratropium-albuterol (DUONEB) 0.5-2.5 (3) MG/3ML SOLN Take 3 mLs by nebulization every 4 (four) hours as needed (breathing).  ° lisinopril (ZESTRIL) 20 MG tablet Take 1 tablet (20 mg total) by mouth daily.  ° metFORMIN (GLUCOPHAGE) 500 MG tablet Take 2 tablets (1,000 mg total) by mouth daily with breakfast. (NEEDS TO BE SEEN BEFORE NEXT REFILL)  ° QUEtiapine (SEROQUEL) 25 MG tablet Take 1 tablet (25 mg total) by mouth at bedtime.  ° °No facility-administered encounter medications on file as of 04/08/2021.  ° ° °Patient Active Problem List  ° Diagnosis Date Noted  ° Psychophysiological insomnia 09/07/2020  ° History of drug abuse (HCC) 08/12/2020  ° Hyperglycemia 08/12/2020  ° Type 2 diabetes mellitus without complication (HCC) 08/12/2020  ° Hypomagnesemia 08/12/2020  ° Hypokalemia 08/12/2020  ° Leukocytosis 08/12/2020  ° Mycobacterium avium complex (HCC) 03/12/2019  ° Mixed simple and mucopurulent chronic bronchitis (HCC) 03/12/2019  ° Erectile dysfunction 09/19/2018  ° Essential hypertension 09/19/2018  ° ° °  09/19/2018    Conditions to be addressed/monitored: monitor housing needs of client. Monitor client completion of ADLs  Care Plan : LCSW Care Plan  Updates made by Katha Cabal, LCSW since 04/08/2021 12:00 AM     Problem: Coping  Skills (General Plan of Care)      Goal: Coping Skills Enhanced;Complete ADLs as needed.Research housing options in the area   Start Date: 04/08/2021  Expected End Date: 07/01/2021  This Visit's Progress: On track  Recent Progress: On track  Priority: High  Note:   Current barriers:   Patient in need of assistance with connecting to community resources for possible help in completing ADLs or other daily activities Patient is unable to independently navigate community resource options without care coordination support Difficulty with mobility Housing needs  Clinical Goals:  patient will work with SW monthly to address concerns related to ADLs completion of client Patient will talk with SW monthly to discuss self care issues of client in home environment Patient will talk with LCSW in next 30 days to discuss housing resources for client  Clinical Interventions:  Collaboration with [name], MD regarding development and update of comprehensive plan of care as evidenced by provider attestation and co-signature Discussed client needs with [name], sister of client. Discussed PCP support for client. Allene Dillon reported that client has been trying to get established with new PCP in area. .Client is now staying in [place] in an extended stay hotel. Allene Dillon, client sister, lives nearby.  Reviewed food support for client. Allene Dillon said client is doing well with food procurement. She makes sure he has adequate food to eat since she lives nearby. Discussed with Eric care facilities for client.  Allene Dillon said client gets a Conservation officer, historic buildings month and gets a pension benefit. However, his benefits are not enough to pay for costs of ALF or costs of SNF.  LCSW did talk with Allene Dillon about Group Home support possibly for client. Discussed Medicaid application for client with [name] Provided counseling support for Eric related to client needs.  Provided Eric phone  number for LCSW [phone]) and invited client or Allene Dillon to call LCSW as needed to further discuss housing resources for client in the area Discussed with Glendale Chard at [facility] in [place], [place] as a possible resource for client for medical care. Eric wrote down name of [place] at [facility] and said she would call practice to see if they are taking new medical patients.  Eric and client are in [place], [place] which is not too far of a drive from [place], [place].  Patient Coping Skills: Has some family support Takes medications as prescribed  Patient Deficits  Mobility issues Difficulties in completing ADLs  Patient Goals:   Patient will attend all scheduled medical appointments in next 30 days Patient will talk regularly with sister, [name], in next 30 days to discuss client needs Patient or Allene Dillon will call RNCM or LCSW as needed in next 30 days for CCM support -  Follow Up Plan: LCSW to call client or [name] on 06/03/21 at 1:30 PM to assess client neeeds      Norva Riffle.Kashena Novitski MSW, LCSW Licensed Clinical Social Worker [facility] Care Management [phone]

## 2021-04-08 NOTE — Patient Instructions (Addendum)
Visit Information  Patient Goals:  Find Help in My Community (Patient).Complete ADLs daily, as able Research housing options in the area.  Timeframe:  Short-Term Goal Priority:  High Progress: On Track Start Date:          04/08/21         Expected End Date:        07/01/20     Follow Up Date   06/03/21 at 1:30 PM   Find Help in My Community (Patient)  Complete ADLs daily, as able .Research housing options in the area   Why is this important?   Knowing how and where to find help for yourself or family in your neighborhood and community is an important skill.  You will want to take some steps to learn how.    Patient Coping Skills: Has some family support Takes medications as prescribed  Patient Deficits  Mobility issues Difficulties in completing ADLs Housing needs  Patient Goals:   Patient will attend all scheduled medical appointments in next 30 days Patient will talk regularly with sister, Eric Leach, in next 30 days to discuss client needs Patient or sister, Eric Leach, will call RNCM or LCSW as needed in next 30 days for CCM support -  Follow Up Plan: LCSW to call client or sister of client , Eric Leach on 06/03/21 at 1:30 PM to assess client needs.    Norva Riffle.Eric Leach MSW, LCSW Licensed Clinical Social Worker [facility] Care Management [phone]

## 2021-06-03 ENCOUNTER — Ambulatory Visit (INDEPENDENT_AMBULATORY_CARE_PROVIDER_SITE_OTHER): Payer: Medicare Other

## 2021-06-03 DIAGNOSIS — R739 Hyperglycemia, unspecified: Secondary | ICD-10-CM

## 2021-06-03 DIAGNOSIS — E119 Type 2 diabetes mellitus without complications: Secondary | ICD-10-CM

## 2021-06-03 DIAGNOSIS — I10 Essential (primary) hypertension: Secondary | ICD-10-CM

## 2021-06-03 NOTE — Patient Instructions (Addendum)
Visit Information  Patient Goals:  Find Help in My Community (Patient). Complete ADLs daily, as able. Research housing options in the area.  Timeframe:  Short-Term Goal Priority:  Medium Progress: On Track Start Date:         08/25/20         Expected End Date:        06/03/21       Follow Up Date   LCSW is discharging client today from CCM services since client moved to [facility] and has established care with PCP, Dr. Theotis Barrio, MD   Find Help in My Community (Patient)  Complete ADLs daily, as able .Research housing options in the area   Why is this important?   Knowing how and where to find help for yourself or family in your neighborhood and community is an important skill.  You will want to take some steps to learn how.    Patient Coping Skills: Has some family support Takes medications as prescribed  Patient Deficits  Mobility issues Difficulties in completing ADLs Housing needs  Patient Goals:   Patient will attend all scheduled medical appointments in next 30 days Patient will talk regularly with sister, Eric Leach, in next 30 days to discuss client needs Patient or sister, Eric Leach, will call RNCM or LCSW as needed in next 30 days for CCM support -  Follow Up Plan: LCSW is discharging client today from CCM services since client moved to [facility] and established care with PCP, Dr. Theotis Barrio, MD.    Norva Riffle.Elo Marmolejos MSW, [place] Holiday representative [facility] Care Management [phone]

## 2021-06-03 NOTE — Chronic Care Management (AMB) (Signed)
Chronic Care Management    Clinical Social Work Note  06/03/2021 Name: Eric Leach MRN: [id number] DOB: Jan 27, 1951  Eric Leach is a 71 y.o. year old male who is a primary care patient of Stacks, Cletus Gash, MD. The CCM team was consulted to assist the patient with chronic disease management and/or care coordination needs related to: [employer] .   Engaged with patient / sister of patient, [name], by telephone for follow up visit in response to provider referral for social work chronic care management and care coordination services.   Consent to Services:  The patient was given information about Chronic Care Management services, agreed to services, and gave verbal consent prior to initiation of services.  Please see initial visit note for detailed documentation.   Patient agreed to services and consent obtained.   Assessment: Review of patient past medical history, allergies, medications, and health status, including review of relevant consultants reports was performed today as part of a comprehensive evaluation and provision of chronic care management and care coordination services.     SDOH (Social Determinants of Health) assessments and interventions performed:  SDOH Interventions    Flowsheet Row Most Recent Value  SDOH Interventions   Stress Interventions Other (Comment)  [client has stress related to managing medical needs. has stress related to managing finances]  Depression Interventions/Treatment  Counseling        Advanced Directives Status: See Vynca application for related entries.  CCM Care Plan  Allergies  Allergen Reactions   Sulfa Antibiotics     Other reaction(s): Unknown    Outpatient Encounter Medications as of 06/03/2021  Medication Sig   albuterol (VENTOLIN HFA) 108 (90 Base) MCG/ACT inhaler Inhale 2 puffs into the lungs every 6 (six) hours as needed for wheezing or shortness of breath.   amLODipine (NORVASC) 10 MG tablet Take 1  tablet (10 mg total) by mouth daily.   blood glucose meter kit and supplies Dispense based on patient and insurance preference. Use up to four times daily as directed. (FOR ICD-10 E10.9, E11.9).   Budeson-Glycopyrrol-Formoterol (BREZTRI AEROSPHERE) 160-9-4.8 MCG/ACT AERO Inhale 2 puffs into the lungs in the morning and at bedtime.   Continuous Blood Gluc Sensor (FREESTYLE LIBRE SENSOR SYSTEM) MISC Measure glucose before meals and at bedtime   hydrochlorothiazide (HYDRODIURIL) 25 MG tablet Take 1 tablet (25 mg total) by mouth daily.   ipratropium-albuterol (DUONEB) 0.5-2.5 (3) MG/3ML SOLN Take 3 mLs by nebulization every 4 (four) hours as needed (breathing).   lisinopril (ZESTRIL) 20 MG tablet Take 1 tablet (20 mg total) by mouth daily.   metFORMIN (GLUCOPHAGE) 500 MG tablet Take 2 tablets (1,000 mg total) by mouth daily with breakfast. (NEEDS TO BE SEEN BEFORE NEXT REFILL)   QUEtiapine (SEROQUEL) 25 MG tablet Take 1 tablet (25 mg total) by mouth at bedtime.   No facility-administered encounter medications on file as of 06/03/2021.    Patient Active Problem List   Diagnosis Date Noted   Psychophysiological insomnia 09/07/2020   History of drug abuse ([place]) 08/12/2020   Hyperglycemia 08/12/2020   Type 2 diabetes mellitus without complication ([place]) 67/34/1937   Hypomagnesemia 08/12/2020   Hypokalemia 08/12/2020   Leukocytosis 08/12/2020   Mycobacterium avium complex ([place]) 03/12/2019   Mixed simple and mucopurulent chronic bronchitis ([place]) 03/12/2019   Erectile dysfunction 09/19/2018   Essential hypertension 09/19/2018    Conditions to be addressed/monitored: monitor client completion of ADLs  Care Plan : LCSW Care Plan  Updates made by Kathlynn Swofford,  Norva Riffle, LCSW since 06/03/2021 12:00 AM     Problem: Coping Skills (General Plan of Care)      Goal: Coping Skills Enhanced;Complete ADLs as needed.Research housing options in the area   Start Date: 06/03/2021  Expected End Date: 08/30/2021  This  Visit's Progress: On track  Recent Progress: On track  Priority: Medium  Note:   Current barriers:   Patient in need of assistance with connecting to community resources for possible help in completing ADLs or other daily activities Patient is unable to independently navigate community resource options without care coordination support Difficulty with mobility Housing needs  Clinical Goals:  patient will work with SW monthly to address concerns related to ADLs completion of client Patient will talk with SW monthly to discuss self care issues of client in home environment Patient will talk with LCSW in next 30 days to discuss housing resources for client  Clinical Interventions:  Collaboration with [name], MD regarding development and update of comprehensive plan of care as evidenced by provider attestation and co-signature Discussed client needs with [name], sister of client. Discussed housing needs of client with [place]. Allene Dillon reported that client is staying at an Extended Stay [facility] in [place] ,[place].   Discussed PCP support for client. Allene Dillon reported that client had been trying to get established with new PCP in area. In January of 2023 client established care with new PCP in [place] , [place] area.  Client has had his initial appointment to establish care with Dr. Theotis Barrio, MD, [place] [facility] [place] at [facility], [address] [place], [place]  [address] . Phone number:  [phone] Fax: [phone].  Allene Dillon said client established care with Dr. Theotis Barrio and was looking forward to receiving medical care at that office. Reviewed food support for client. Allene Dillon said client is doing well with food procurement. She makes sure he has adequate food to eat since she lives nearby. Discussed with Allene Dillon the role of APS through Department of Social Services.  Explained that each [place] in [place] has [place] [facility] [place] for that [place].  .Provided  counseling support for [place] related to client needs.  Informed [place] that LCSW would close client case today since client has established care with a new PCP in [place], [place]. [place] agreed to plan and will inform client of this information.   Patient Coping Skills: Has some family support Takes medications as prescribed  Patient Deficits  Mobility issues Difficulties in completing ADLs  Patient Goals:   Patient will attend all scheduled medical appointments in next 30 days Patient will talk regularly with sister, [name], in next 30 days to discuss client needs  Follow Up Plan: LCSW is discharging client today from CCM program since client now lives in [place], [place] and has established care with new PCP, Dr. Theotis Barrio, MD.      Norva Riffle.Camiyah Friberg MSW, [place] Holiday representative [facility] Care Management [phone]

## 2022-03-07 ENCOUNTER — Telehealth: Payer: Self-pay

## 2022-03-07 NOTE — Telephone Encounter (Signed)
Transition Care Management Unsuccessful Follow-up Telephone Call  Date of discharge and from where:  03/04/2022 [facility] ED  Attempts:  1st Attempt  Reason for unsuccessful TCM follow-up call:  Left voice message

## 2022-03-09 NOTE — Telephone Encounter (Signed)
Transition Care Management Unsuccessful Follow-up Telephone Call  Date of discharge and from where:  03/04/22 [facility]  Attempts:  2nd Attempt  Reason for unsuccessful TCM follow-up call:  Left voice message

## 2022-03-14 NOTE — Telephone Encounter (Signed)
Transition Care Management Unsuccessful Follow-up Telephone Call  Date of discharge and from where:  03/04/22 [facility]  Attempts:  3rd Attempt  Reason for unsuccessful TCM follow-up call:  Unable to reach patient - letter sent Mychart and mail

## 2023-09-08 ENCOUNTER — Telehealth: Payer: Self-pay

## 2023-09-08 NOTE — Transitions of Care (Post Inpatient/ED Visit) (Signed)
   09/08/2023  Name: Eric Leach MRN: [id number] DOB: May 15, 1950  Today's TOC FU Call Status: Today's TOC FU Call Status:: Unsuccessful Call (1st Attempt) Unsuccessful Call (1st Attempt) Date: 09/08/23  Attempted to reach the patient regarding the most recent Inpatient/ED visit.  Follow Up Plan: Additional outreach attempts will be made to reach the patient to complete the Transitions of Care (Post Inpatient/ED visit) call.   Tonia Frankel RN, CCM [place]   VBCI-Population Health RN Care Manager [phone]

## 2023-09-11 ENCOUNTER — Telehealth: Payer: Self-pay

## 2023-09-11 NOTE — Transitions of Care (Post Inpatient/ED Visit) (Signed)
   09/11/2023  Name: Eric Leach MRN: [id number] DOB: [birth year]  Today's TOC FU Call Status: Today's TOC FU Call Status:: Unsuccessful Call (2nd Attempt) Unsuccessful Call (2nd Attempt) Date: 09/11/23  Attempted to reach the patient regarding the most recent Inpatient/ED visit.  Follow Up Plan: Additional outreach attempts will be made to reach the patient to complete the Transitions of Care (Post Inpatient/ED visit) call.   Tonia Frankel RN, CCM [place]  VBCI-Population Health RN Care Manager [phone]

## 2023-09-12 ENCOUNTER — Telehealth: Payer: Self-pay

## 2023-09-12 NOTE — Transitions of Care (Post Inpatient/ED Visit) (Signed)
   09/12/2023  Name: Eric Leach MRN: [id number] DOB: 19-Apr-1951  Today's TOC FU Call Status: Today's TOC FU Call Status:: Unsuccessful Call (3rd Attempt) Unsuccessful Call (3rd Attempt) Date: 09/12/23  Attempted to reach the patient regarding the most recent Inpatient/ED visit.  Follow Up Plan: No further outreach attempts will be made at this time. We have been unable to contact the patient.  Tonia Frankel RN, CCM [place]  VBCI-Population Health RN Care Manager [phone]
